# Patient Record
Sex: Female | Born: 2000
Health system: Southern US, Community
[De-identification: ages and names within clinical notes are randomized; demographics above are authoritative.]

## PROBLEM LIST (undated history)

## (undated) DIAGNOSIS — F32A Depression, unspecified: Secondary | ICD-10-CM

## (undated) DIAGNOSIS — F329 Major depressive disorder, single episode, unspecified: Secondary | ICD-10-CM

## (undated) DIAGNOSIS — F419 Anxiety disorder, unspecified: Secondary | ICD-10-CM

## (undated) HISTORY — PX: THYROID CYST EXCISION: SHX2511

## (undated) HISTORY — DX: Anxiety disorder, unspecified: F41.9

## (undated) HISTORY — DX: Major depressive disorder, single episode, unspecified: F32.9

## (undated) HISTORY — DX: Depression, unspecified: F32.A

---
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
## 2018-03-16 IMAGING — US US EXTREM LOW VENOUS*R*
1 series · 13 of 24 positions shown · non-contrast
Comparison: None.

CLINICAL DATA: Right ankle and knee pain.



[Series 1: us extrem low venous*right* · 0.06mm/px · 13 of 36 slices shown]
[im 1/36]
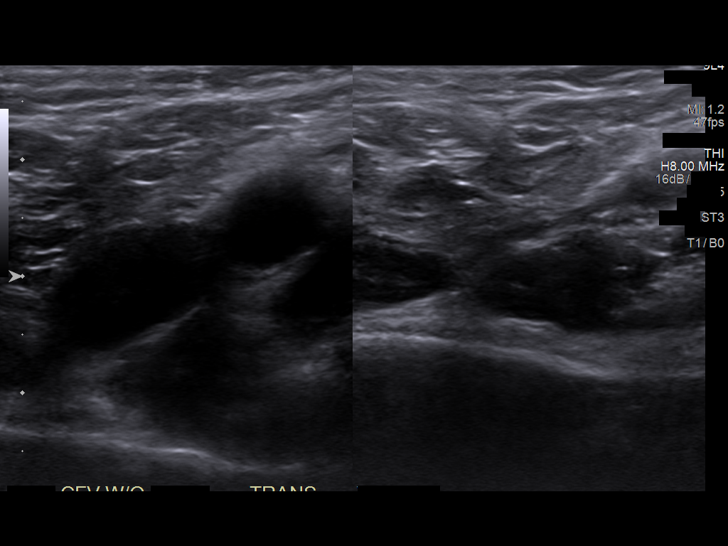
[im 4/36]
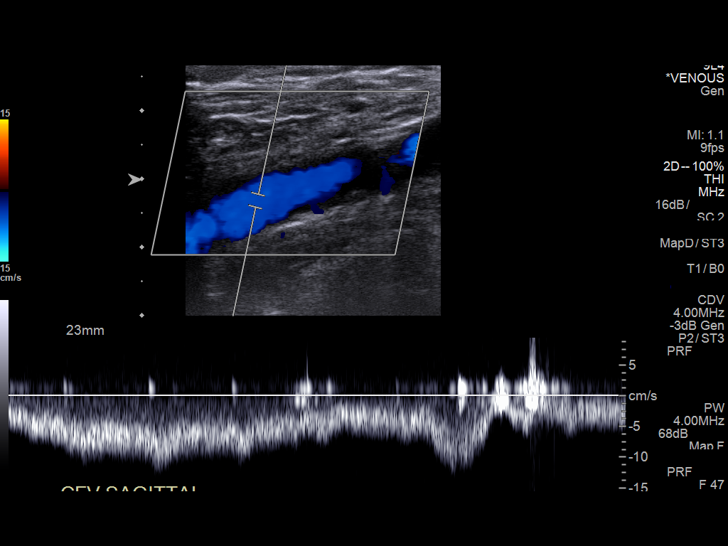
[im 7/36]
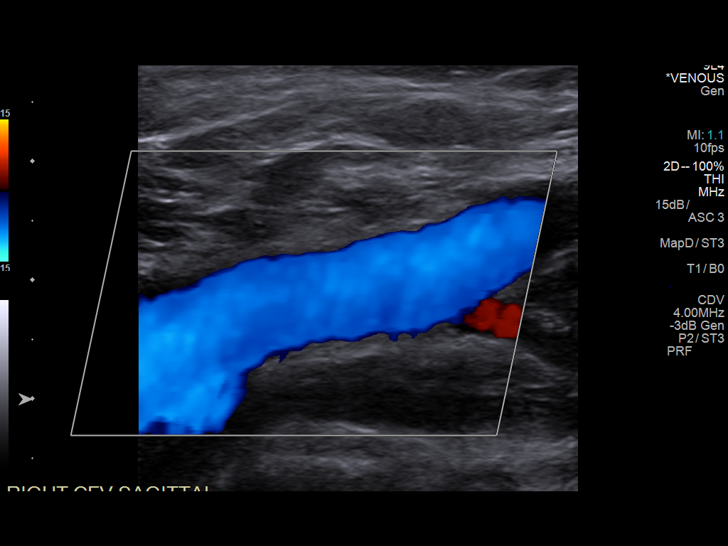
[im 10/36]
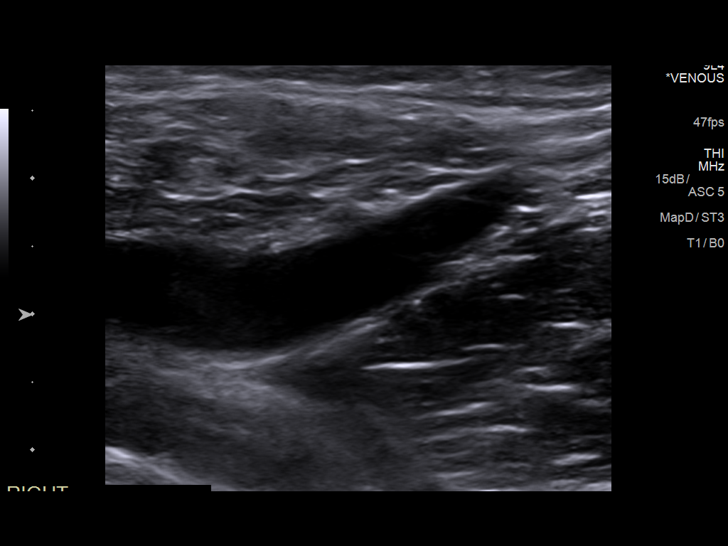
[im 13/36]
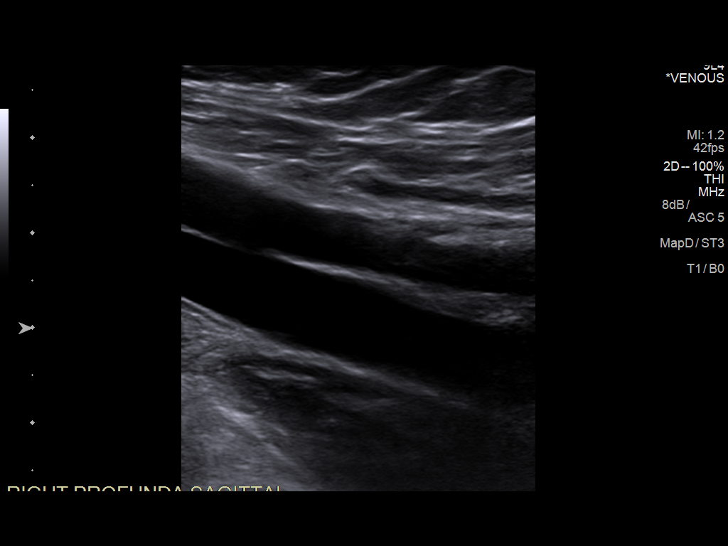
[im 16/36]
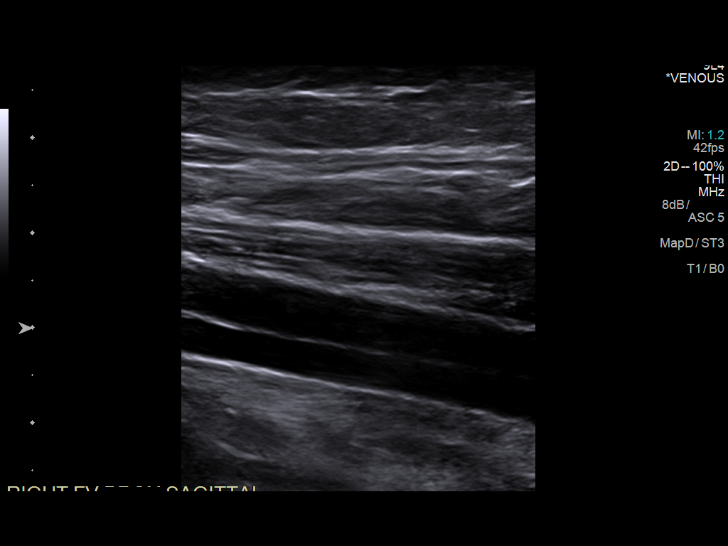
[im 19/36]
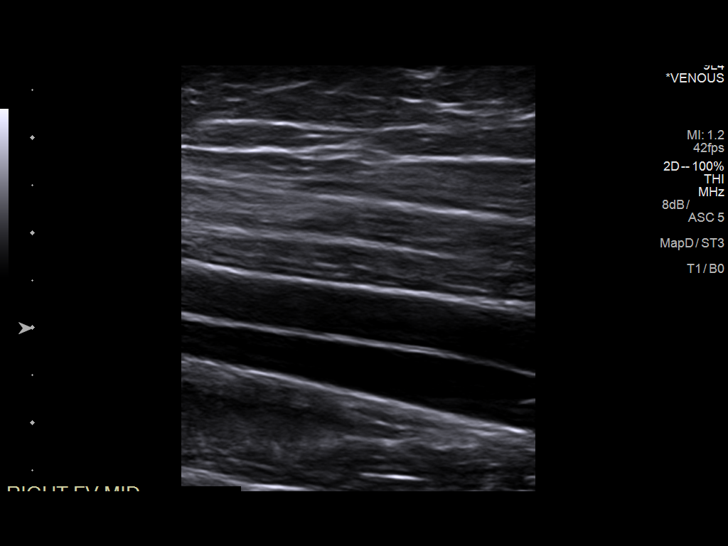
[im 20/36]
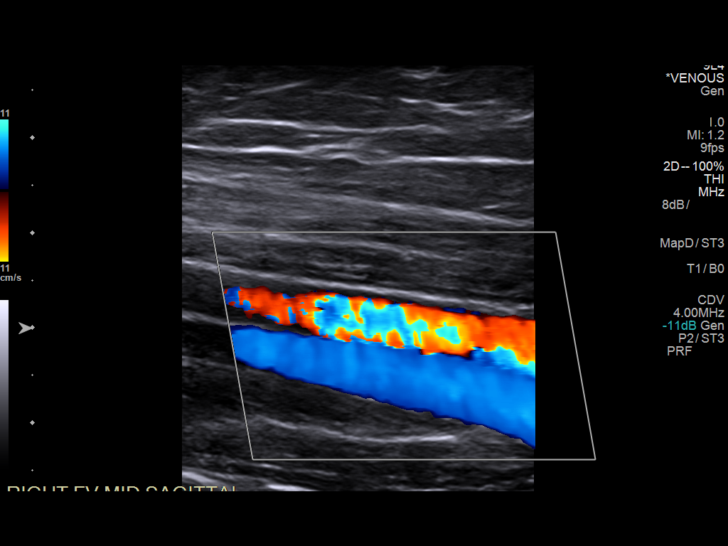
[im 23/36]
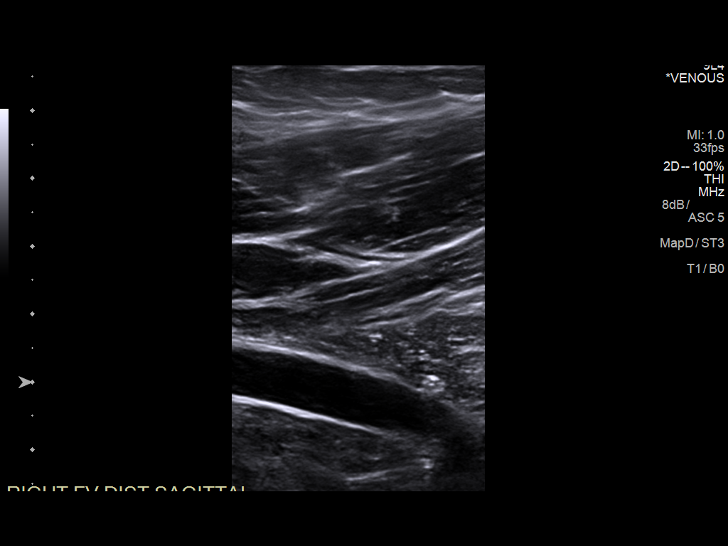
[im 26/36]
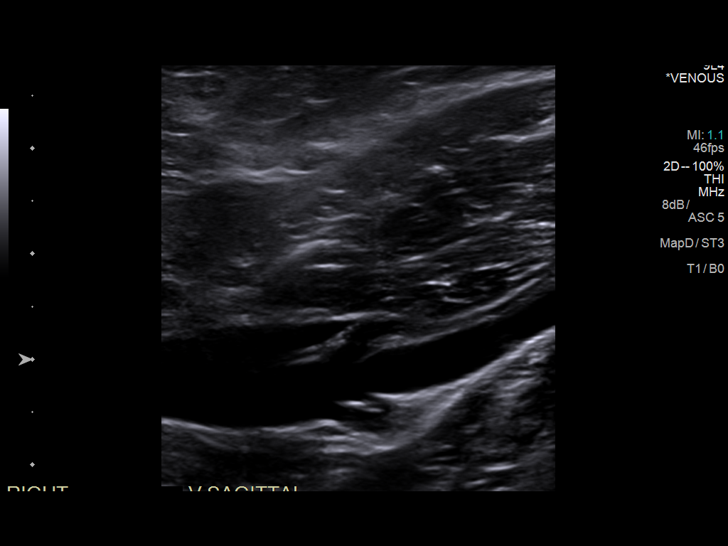
[im 29/36]
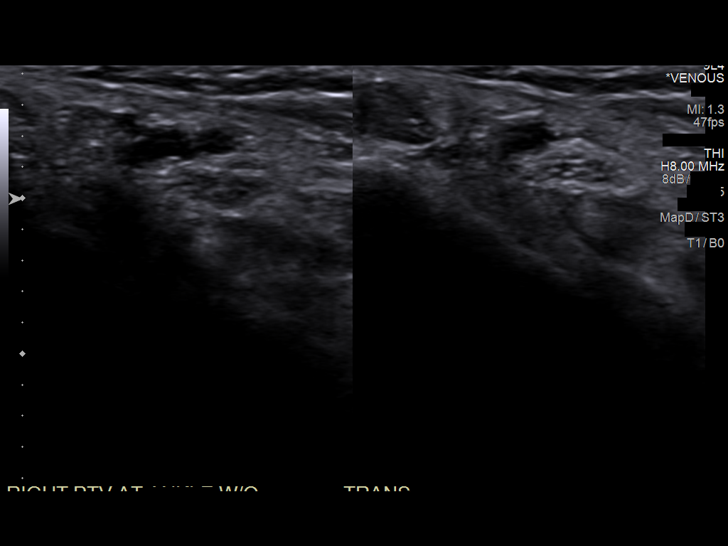
[im 32/36]
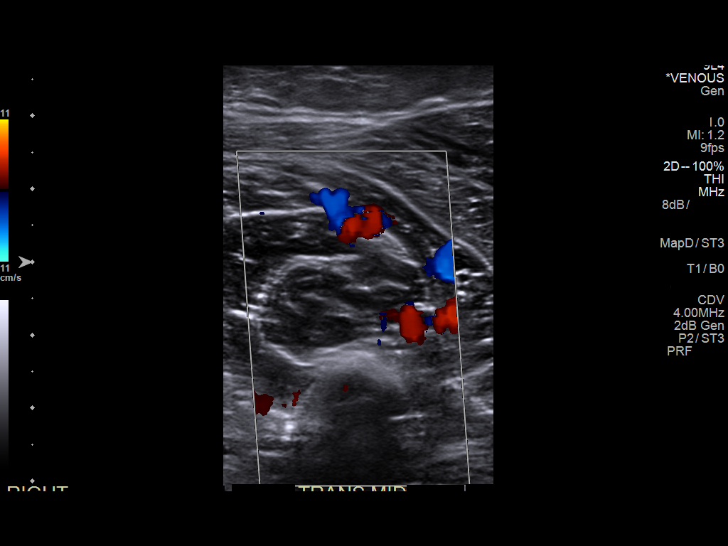
[im 36/36]
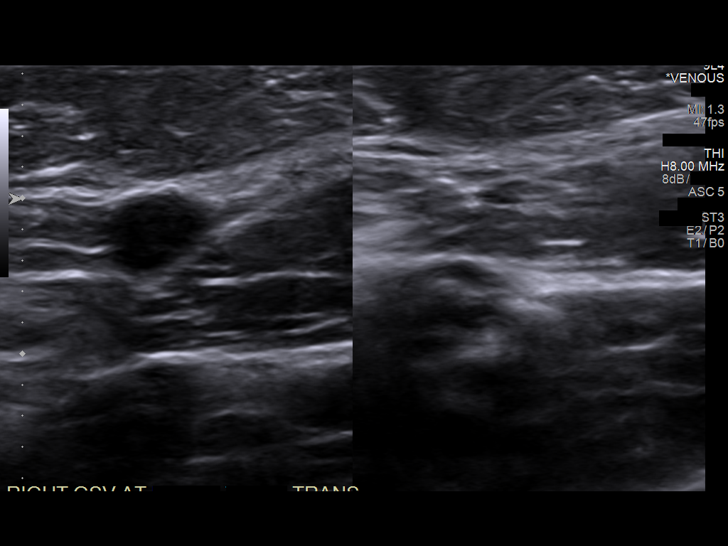

[13 of 24 positions shown; findings below may reference images not displayed]

FINDINGS: Contralateral Common Femoral Vein: Respiratory phasicity is normal
and symmetric with the symptomatic side. No evidence of thrombus.
Normal compressibility.

Common Femoral Vein: No evidence of thrombus. Normal
compressibility, respiratory phasicity and response to augmentation.

Saphenofemoral Junction: No evidence of thrombus. Normal
compressibility and flow on color Doppler imaging.

Profunda Femoral Vein: No evidence of thrombus. Normal
compressibility and flow on color Doppler imaging.

Femoral Vein: No evidence of thrombus. Normal compressibility,
respiratory phasicity and response to augmentation.

Popliteal Vein: No evidence of thrombus. Normal compressibility,
respiratory phasicity and response to augmentation.

Calf Veins: No evidence of thrombus. Normal compressibility and flow
on color Doppler imaging.

Superficial Great Saphenous Vein: No evidence of thrombus. Normal
compressibility and flow on color Doppler imaging.

Venous Reflux:  None.

Other Findings: No evidence of superficial thrombophlebitis or
abnormal fluid collection.
IMPRESSION: No evidence of right lower extremity deep venous thrombosis.

## 2018-03-27 ENCOUNTER — Other Ambulatory Visit: Payer: Self-pay | Admitting: Family Medicine

## 2018-03-27 DIAGNOSIS — F411 Generalized anxiety disorder: Secondary | ICD-10-CM

## 2018-04-27 ENCOUNTER — Other Ambulatory Visit: Payer: Self-pay | Admitting: Family Medicine

## 2018-04-27 DIAGNOSIS — F411 Generalized anxiety disorder: Secondary | ICD-10-CM

## 2018-05-15 DIAGNOSIS — J0140 Acute pansinusitis, unspecified: Secondary | ICD-10-CM | POA: Diagnosis not present

## 2018-05-21 ENCOUNTER — Other Ambulatory Visit: Payer: Self-pay | Admitting: Family Medicine

## 2018-05-21 DIAGNOSIS — F411 Generalized anxiety disorder: Secondary | ICD-10-CM

## 2018-06-20 ENCOUNTER — Other Ambulatory Visit: Payer: Self-pay | Admitting: Family Medicine

## 2018-06-20 DIAGNOSIS — F411 Generalized anxiety disorder: Secondary | ICD-10-CM

## 2018-07-18 ENCOUNTER — Other Ambulatory Visit: Payer: Self-pay | Admitting: Family Medicine

## 2018-07-18 DIAGNOSIS — Z7689 Persons encountering health services in other specified circumstances: Secondary | ICD-10-CM

## 2018-07-22 ENCOUNTER — Other Ambulatory Visit: Payer: Self-pay | Admitting: Family Medicine

## 2018-07-22 DIAGNOSIS — F411 Generalized anxiety disorder: Secondary | ICD-10-CM

## 2018-08-16 DIAGNOSIS — Z113 Encounter for screening for infections with a predominantly sexual mode of transmission: Secondary | ICD-10-CM | POA: Diagnosis not present

## 2018-08-16 DIAGNOSIS — Z6822 Body mass index (BMI) 22.0-22.9, adult: Secondary | ICD-10-CM | POA: Diagnosis not present

## 2018-08-16 DIAGNOSIS — N760 Acute vaginitis: Secondary | ICD-10-CM | POA: Diagnosis not present

## 2018-08-16 DIAGNOSIS — Z01419 Encounter for gynecological examination (general) (routine) without abnormal findings: Secondary | ICD-10-CM | POA: Diagnosis not present

## 2018-08-22 ENCOUNTER — Other Ambulatory Visit: Payer: Self-pay | Admitting: Family Medicine

## 2018-08-22 DIAGNOSIS — F411 Generalized anxiety disorder: Secondary | ICD-10-CM

## 2018-09-10 DIAGNOSIS — M9905 Segmental and somatic dysfunction of pelvic region: Secondary | ICD-10-CM | POA: Diagnosis not present

## 2018-09-10 DIAGNOSIS — M9903 Segmental and somatic dysfunction of lumbar region: Secondary | ICD-10-CM | POA: Diagnosis not present

## 2018-09-10 DIAGNOSIS — M9902 Segmental and somatic dysfunction of thoracic region: Secondary | ICD-10-CM | POA: Diagnosis not present

## 2018-09-10 DIAGNOSIS — M9901 Segmental and somatic dysfunction of cervical region: Secondary | ICD-10-CM | POA: Diagnosis not present

## 2018-09-16 ENCOUNTER — Other Ambulatory Visit: Payer: Self-pay | Admitting: Family Medicine

## 2018-09-16 DIAGNOSIS — F411 Generalized anxiety disorder: Secondary | ICD-10-CM

## 2018-09-22 DIAGNOSIS — J069 Acute upper respiratory infection, unspecified: Secondary | ICD-10-CM | POA: Diagnosis not present

## 2018-09-22 DIAGNOSIS — R6889 Other general symptoms and signs: Secondary | ICD-10-CM | POA: Diagnosis not present

## 2018-12-29 ENCOUNTER — Other Ambulatory Visit: Payer: Self-pay | Admitting: Family Medicine

## 2018-12-29 DIAGNOSIS — Z7689 Persons encountering health services in other specified circumstances: Secondary | ICD-10-CM

## 2018-12-29 MED ORDER — NORETHIN ACE-ETH ESTRAD-FE 1-20 MG-MCG PO TABS: 1.0000 | ORAL_TABLET | Freq: Every day | ORAL | 0 refills | Status: DC

## 2018-12-29 NOTE — Telephone Encounter (Signed)
Refill request for Junell; no valid encounter in last 12 months; no upcoming visits noted; last refill 07/18/18; contacted pt and made her aware; she verbalized understanding and would like to schedule appointment; the pt can be contacted at [email or web address] or [phone]; a message can be left on voicemail; conference called initiated with Annice Pih at [facility]; per Annice Pih pt scheduled virtual appointment with Dr Abbe Amsterdam, [facility], 12/30/2018 at 1020; she verbalized understanding; will grant 1 month courtesy refill; will route to office for notification.   Requested Prescriptions  Pending Prescriptions Disp Refills  . norethindrone-ethinyl estradiol (JUNEL FE 1/20) 1-20 MG-MCG tablet 84 tablet 1    Sig: Take 1 tablet by mouth daily.     OB/GYN:  Contraceptives Failed - 12/29/2018  2:34 PM      Failed - Valid encounter within last 12 months    Recent Outpatient Visits          1 year ago Concussion without loss of consciousness, initial encounter   Holiday representative at [facility] [place], Gwenlyn Found, MD   2 years ago Flu-like symptoms   Holiday representative at [facility], [place], [place]   2 years ago Chronic fatigue   Holiday representative at [facility], Gwenlyn Found, MD             Passed - Last BP in normal range    BP Readings from Last 1 Encounters:  09/10/17 98/61

## 2018-12-30 ENCOUNTER — Encounter: Payer: Self-pay | Admitting: Family Medicine

## 2018-12-30 ENCOUNTER — Ambulatory Visit (INDEPENDENT_AMBULATORY_CARE_PROVIDER_SITE_OTHER): Payer: Federal, State, Local not specified - PPO | Admitting: Family Medicine

## 2018-12-30 ENCOUNTER — Other Ambulatory Visit: Payer: Self-pay

## 2018-12-30 DIAGNOSIS — F411 Generalized anxiety disorder: Secondary | ICD-10-CM | POA: Diagnosis not present

## 2018-12-30 DIAGNOSIS — Z7689 Persons encountering health services in other specified circumstances: Secondary | ICD-10-CM | POA: Diagnosis not present

## 2018-12-30 MED ORDER — NORETHIN ACE-ETH ESTRAD-FE 1-20 MG-MCG PO TABS: 1.0000 | ORAL_TABLET | Freq: Every day | ORAL | 4 refills | Status: DC

## 2018-12-30 MED ORDER — FLUOXETINE HCL 40 MG PO CAPS: ORAL_CAPSULE | ORAL | 3 refills | Status: DC

## 2018-12-30 NOTE — Progress Notes (Signed)
[facility] [address], [address] [place], [place] [address] [phone] [phone]  Date:  12/30/2018   Name:  Leah Park   DOB:  05/31/2001   MRN:  [id number]  PCP:  Pearline Cablesopland, Maeve Debord C, MD    Chief Complaint: No chief complaint on file.   History of Present Illness:  Leah Park is a 18 y.o. very pleasant female patient who presents with the following:  Pt who I last saw in 08/2017, here today to discuss contraception Pt ID confirmed by name and DOB Pt location is home Provider location is office Patient consents to virtual visit today due to COVID-19 pandemic  She has been on OCP since middle school for menstrual regulation She feels like it works well for her, she has no concerns Her menses are well controlled She does a monthly bleed  No history of cancer or stroke, no blood clot Never a smoker She is not currently sexually active She does not check her BP at home, but her pressure has always been quite normal when checked in the past  BP Readings from Last 3 Encounters:  09/10/17 98/61  04/23/17 107/75  10/31/16 121/71 (77 %, Z = 0.74 /  67 %, Z = 0.43)*   *BP percentiles are based on the 2017 AAP Clinical Practice Guideline for girls   She did run out of her pills for a few days, but her pharmacy was then able to resupply her. She is not SA so we are not concerned about pregnancy  She is using prozac for anxiety- she does feel like it helps control her symptoms She does not have much in the way of depression, more anxiety.  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  She does actually like this style of learning as it let's her work at her own pace She was supposed to have an internship this summer but it got canceled.  She hopes to get a summer job instead, perhaps at [employer]  Patient Active Problem List   Diagnosis Date Noted  . 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 GAD (generalized anxiety disorder) 12/22/2013  . MDD (major depressive disorder) 12/22/2013    Past Medical History:  Diagnosis Date  . Anxiety   . Depression     Past Surgical History:  Procedure Laterality Date  . THYROID CYST EXCISION      Social History   Tobacco Use  . Smoking status: Never Smoker  . Smokeless tobacco: Never Used  Substance Use Topics  . Alcohol use: No  . Drug use: No    Family History  Problem Relation Age of Onset  . Depression Maternal Aunt   . Anxiety disorder Maternal Aunt   . Bipolar disorder Maternal Aunt   . Anxiety disorder Cousin     No Known Allergies  Medication list has been reviewed and updated.  Current Outpatient Medications on File Prior to Visit  Medication Sig Dispense Refill  . FLUoxetine (PROZAC) 40 MG capsule TAKE 1 CAPSULE BY MOUTH EVERY DAY 30 capsule 0  . Multiple Vitamins-Iron (ONE DAILY MULTIVITAMIN/IRON) TABS Take by mouth.    . norethindrone-ethinyl estradiol (JUNEL FE 1/20) 1-20 MG-MCG tablet Take 1 tablet by mouth daily. 84 tablet 0  . Probiotic Product (PROBIOTIC DAILY PO) Take by mouth.     No current facility-administered  medications on file prior to visit.     Review of Systems:  As per HPI- otherwise negative. No cough or fever  Physical Examination: There were no vitals filed for this visit. There were no vitals filed for this visit. There is no height or weight on file to calculate BMI. Ideal Body Weight:    Pt observed via web video today  She appears well, normal weight, no distress, no cough, wheezing, tachypnea is noted  Assessment and Plan: Encounter for menstrual regulation - Plan: norethindrone-ethinyl estradiol (JUNEL FE 1/20) 1-20 MG-MCG tablet  GAD (generalized anxiety disorder) - Plan: FLUoxetine (PROZAC) 40 MG capsule  Virtual visit today to discuss birth control pills and anxiety.  I refilled her birth control pills for 1  year, also refilled her Prozac She is handling the upheaval in her life well, but I asked her to let me know if her anxiety is getting the best of her We will send a summary message to patient via my chart  Signed Abbe Amsterdam, MD

## 2018-12-30 NOTE — Patient Instructions (Signed)
It was very nice to talk with you today I hope that the rest of your school year continues to go well online  Continue Prozac 40 mg daily for anxiety-let me know if your symptoms are worsening due to current circumstances Continue birth control pills.  If you did miss a few pills due to running out, there is a chance she could become pregnant if you were sexually active around that time.  If you have any concerns, certainly take an over-the-counter home pregnancy test.  Please see me in the office in 4 to 6 months, so we can check on your blood pressure and talk face-to-face  Take care!

## 2019-01-28 DIAGNOSIS — M9901 Segmental and somatic dysfunction of cervical region: Secondary | ICD-10-CM | POA: Diagnosis not present

## 2019-01-28 DIAGNOSIS — M9903 Segmental and somatic dysfunction of lumbar region: Secondary | ICD-10-CM | POA: Diagnosis not present

## 2019-01-28 DIAGNOSIS — M9905 Segmental and somatic dysfunction of pelvic region: Secondary | ICD-10-CM | POA: Diagnosis not present

## 2019-01-28 DIAGNOSIS — M9902 Segmental and somatic dysfunction of thoracic region: Secondary | ICD-10-CM | POA: Diagnosis not present

## 2019-03-04 DIAGNOSIS — N930 Postcoital and contact bleeding: Secondary | ICD-10-CM | POA: Diagnosis not present

## 2019-03-04 DIAGNOSIS — N760 Acute vaginitis: Secondary | ICD-10-CM | POA: Diagnosis not present

## 2019-03-04 DIAGNOSIS — Z113 Encounter for screening for infections with a predominantly sexual mode of transmission: Secondary | ICD-10-CM | POA: Diagnosis not present

## 2019-03-19 ENCOUNTER — Telehealth: Payer: Self-pay | Admitting: Family Medicine

## 2019-03-19 DIAGNOSIS — Z20822 Contact with and (suspected) exposure to covid-19: Secondary | ICD-10-CM

## 2019-03-19 NOTE — Telephone Encounter (Signed)
Spoke with pt's mother and pt scheduled June 29 at 11:15 at [facility]- Pt's mother instructed that everyone should wear face masks and stay in car.

## 2019-03-21 ENCOUNTER — Other Ambulatory Visit: Payer: Federal, State, Local not specified - PPO

## 2019-03-21 DIAGNOSIS — R6889 Other general symptoms and signs: Secondary | ICD-10-CM | POA: Diagnosis not present

## 2019-03-26 LAB — NOVEL CORONAVIRUS, NAA: SARS-CoV-2, NAA: NOT DETECTED

## 2019-04-17 DIAGNOSIS — J22 Unspecified acute lower respiratory infection: Secondary | ICD-10-CM | POA: Diagnosis not present

## 2019-04-17 DIAGNOSIS — Z20828 Contact with and (suspected) exposure to other viral communicable diseases: Secondary | ICD-10-CM | POA: Diagnosis not present

## 2019-08-25 ENCOUNTER — Encounter: Payer: Self-pay | Admitting: Family Medicine

## 2019-08-25 ENCOUNTER — Ambulatory Visit (INDEPENDENT_AMBULATORY_CARE_PROVIDER_SITE_OTHER): Payer: Federal, State, Local not specified - PPO | Admitting: Family Medicine

## 2019-08-25 ENCOUNTER — Other Ambulatory Visit: Payer: Self-pay

## 2019-08-25 DIAGNOSIS — J069 Acute upper respiratory infection, unspecified: Secondary | ICD-10-CM | POA: Diagnosis not present

## 2019-08-25 NOTE — Progress Notes (Signed)
[facility] at [facility] [address] [place], [place] [address] [phone]  Date:  08/25/2019   Name:  Leah Park   DOB:  [birth year]   MRN:  [id number]  PCP:  Darreld Mclean, MD    Chief Complaint: No chief complaint on file.   History of Present Illness:  Leah Park is a 18 y.o. 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 GAD (generalized anxiety disorder) 12/22/2013  . MDD (major depressive disorder) 12/22/2013    Past Medical History:  Diagnosis Date  . Anxiety   . Depression     Past Surgical History:  Procedure Laterality Date  . THYROID CYST EXCISION      Social History   Tobacco Use  . Smoking status: Never Smoker  . Smokeless tobacco: Never Used  Substance Use Topics  . Alcohol use: No  . Drug use: No    Family History  Problem Relation Age of Onset  . Depression Maternal Aunt   . Anxiety disorder Maternal Aunt   . Bipolar disorder Maternal Aunt   . Anxiety disorder Cousin      No Known Allergies  Medication list has been reviewed and updated.  Current Outpatient Medications on File Prior to Visit  Medication Sig Dispense Refill  . FLUoxetine (PROZAC) 40 MG capsule TAKE 1 CAPSULE BY MOUTH EVERY DAY 90 capsule 3  . Multiple Vitamins-Iron (ONE DAILY MULTIVITAMIN/IRON) TABS Take by mouth.    . norethindrone-ethinyl estradiol (JUNEL FE 1/20) 1-20 MG-MCG tablet Take 1 tablet by mouth daily. 3 Package 4  . Probiotic Product (PROBIOTIC DAILY PO) Take by mouth.     No current facility-administered medications on file prior to visit.     Review of Systems:  As per HPI- otherwise negative.   Physical Examination: There were no vitals filed for this visit. There were no vitals filed for this visit. There is no height or weight on file to calculate BMI. Ideal Body Weight:    No fever noted Spoke to pt on the telephone, she sounds well.  No cough, wheezing, shortness of breath is noted  Assessment and Plan: Upper respiratory tract infection, unspecified type - Plan: Novel Coronavirus, NAA ([facility])  Virtual visit today to discuss relatively mild URI symptoms.  We are in the midst of a COVID-19 pandemic, patient should be tested to ensure she does not have COVID-19.  I have ordered this test for her, explained  various testing options.  She will plan to be tested ASAP, we will then follow-up pending her results.  I have asked her to contact me if any worsening, or any other questions Advised patient to quarantine herself until results come in  Spoke with patient on the phone for 7 minutes  Signed Abbe Amsterdam, MD

## 2019-08-26 ENCOUNTER — Other Ambulatory Visit: Payer: Self-pay

## 2019-08-26 DIAGNOSIS — Z20822 Contact with and (suspected) exposure to covid-19: Secondary | ICD-10-CM

## 2019-08-27 LAB — NOVEL CORONAVIRUS, NAA: SARS-CoV-2, NAA: NOT DETECTED

## 2019-10-24 DIAGNOSIS — F411 Generalized anxiety disorder: Secondary | ICD-10-CM | POA: Diagnosis not present

## 2019-10-31 DIAGNOSIS — F411 Generalized anxiety disorder: Secondary | ICD-10-CM | POA: Diagnosis not present

## 2019-10-31 DIAGNOSIS — F429 Obsessive-compulsive disorder, unspecified: Secondary | ICD-10-CM | POA: Diagnosis not present

## 2019-11-14 DIAGNOSIS — F411 Generalized anxiety disorder: Secondary | ICD-10-CM | POA: Diagnosis not present

## 2019-11-28 DIAGNOSIS — F411 Generalized anxiety disorder: Secondary | ICD-10-CM | POA: Diagnosis not present

## 2020-01-11 ENCOUNTER — Other Ambulatory Visit: Payer: Self-pay | Admitting: Family Medicine

## 2020-01-11 DIAGNOSIS — F411 Generalized anxiety disorder: Secondary | ICD-10-CM

## 2020-01-24 DIAGNOSIS — F411 Generalized anxiety disorder: Secondary | ICD-10-CM | POA: Diagnosis not present

## 2020-01-25 ENCOUNTER — Other Ambulatory Visit: Payer: Self-pay | Admitting: Family Medicine

## 2020-01-25 DIAGNOSIS — Z7689 Persons encountering health services in other specified circumstances: Secondary | ICD-10-CM

## 2020-02-08 DIAGNOSIS — F411 Generalized anxiety disorder: Secondary | ICD-10-CM | POA: Diagnosis not present

## 2020-02-22 DIAGNOSIS — F429 Obsessive-compulsive disorder, unspecified: Secondary | ICD-10-CM | POA: Diagnosis not present

## 2020-02-22 DIAGNOSIS — F411 Generalized anxiety disorder: Secondary | ICD-10-CM | POA: Diagnosis not present

## 2020-03-12 DIAGNOSIS — Z309 Encounter for contraceptive management, unspecified: Secondary | ICD-10-CM | POA: Diagnosis not present

## 2020-03-12 DIAGNOSIS — Z113 Encounter for screening for infections with a predominantly sexual mode of transmission: Secondary | ICD-10-CM | POA: Diagnosis not present

## 2020-03-12 DIAGNOSIS — Z01419 Encounter for gynecological examination (general) (routine) without abnormal findings: Secondary | ICD-10-CM | POA: Diagnosis not present

## 2020-03-12 DIAGNOSIS — N86 Erosion and ectropion of cervix uteri: Secondary | ICD-10-CM | POA: Diagnosis not present

## 2020-03-15 LAB — HM PAP SMEAR

## 2020-03-27 DIAGNOSIS — F411 Generalized anxiety disorder: Secondary | ICD-10-CM | POA: Diagnosis not present

## 2020-03-27 DIAGNOSIS — F429 Obsessive-compulsive disorder, unspecified: Secondary | ICD-10-CM | POA: Diagnosis not present

## 2020-03-28 DIAGNOSIS — L0100 Impetigo, unspecified: Secondary | ICD-10-CM | POA: Diagnosis not present

## 2020-03-28 DIAGNOSIS — J029 Acute pharyngitis, unspecified: Secondary | ICD-10-CM | POA: Diagnosis not present

## 2020-03-28 DIAGNOSIS — J020 Streptococcal pharyngitis: Secondary | ICD-10-CM | POA: Diagnosis not present

## 2020-04-12 ENCOUNTER — Other Ambulatory Visit: Payer: Self-pay | Admitting: Family Medicine

## 2020-04-12 DIAGNOSIS — Z7689 Persons encountering health services in other specified circumstances: Secondary | ICD-10-CM

## 2020-04-17 DIAGNOSIS — F411 Generalized anxiety disorder: Secondary | ICD-10-CM | POA: Diagnosis not present

## 2020-04-17 DIAGNOSIS — F429 Obsessive-compulsive disorder, unspecified: Secondary | ICD-10-CM | POA: Diagnosis not present

## 2020-05-02 ENCOUNTER — Encounter: Payer: Self-pay | Admitting: Family Medicine

## 2020-05-02 ENCOUNTER — Other Ambulatory Visit: Payer: Self-pay | Admitting: Family Medicine

## 2020-05-02 NOTE — Telephone Encounter (Signed)
Patient need appointment ?

## 2020-12-10 DIAGNOSIS — F411 Generalized anxiety disorder: Secondary | ICD-10-CM | POA: Diagnosis not present

## 2020-12-18 ENCOUNTER — Ambulatory Visit: Payer: Federal, State, Local not specified - PPO | Admitting: Orthopaedic Surgery

## 2020-12-19 DIAGNOSIS — F411 Generalized anxiety disorder: Secondary | ICD-10-CM | POA: Diagnosis not present

## 2021-01-02 DIAGNOSIS — F411 Generalized anxiety disorder: Secondary | ICD-10-CM | POA: Diagnosis not present

## 2021-01-21 DIAGNOSIS — F411 Generalized anxiety disorder: Secondary | ICD-10-CM | POA: Diagnosis not present

## 2021-02-26 ENCOUNTER — Other Ambulatory Visit: Payer: Self-pay | Admitting: Family Medicine

## 2021-02-26 DIAGNOSIS — F411 Generalized anxiety disorder: Secondary | ICD-10-CM

## 2021-03-09 NOTE — Progress Notes (Deleted)
[facility] at [facility] [address] [place], [place] [address] [phone]  Date:  03/11/2021   Name:  Leah Park   DOB:  03-Oct-2000   MRN:  [id number]  PCP:  Pearline Cables, MD    Chief Complaint: No chief complaint on file.   History of Present Illness:  Leah Park is a 20 y.o. 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 [employer]  STI screening Pap Hpv Covid series Tetanus  Routine BW  Patient Active Problem List   Diagnosis Date Noted   Coccygeal fistula 09/03/2015   GAD (generalized anxiety disorder) 12/22/2013   MDD (major depressive disorder) 12/22/2013    Past Medical History:  Diagnosis Date   Anxiety    Depression     Past Surgical History:  Procedure Laterality Date   THYROID CYST EXCISION      Social History   Tobacco Use   Smoking status: Never   Smokeless tobacco: Never  Substance Use Topics   Alcohol use: No   Drug use: No    Family History  Problem Relation Age of Onset   Depression Maternal Aunt    Anxiety disorder Maternal Aunt    Bipolar disorder Maternal Aunt    Anxiety disorder Cousin     No Known Allergies  Medication list has been reviewed and updated.  Current Outpatient Medications on File Prior to Visit  Medication Sig Dispense Refill   FLUoxetine (PROZAC) 40 MG capsule TAKE 1 CAPSULE BY MOUTH EVERY DAY 90 capsule 3   JUNEL FE 1/20 1-20 MG-MCG tablet TAKE 1 TABLET BY MOUTH EVERY DAY 84 tablet 0   Multiple Vitamins-Iron (ONE DAILY MULTIVITAMIN/IRON) TABS Take by mouth.     Probiotic Product (PROBIOTIC DAILY PO) Take by mouth.     No current facility-administered medications on file prior to visit.    Review of Systems:  As per HPI-  otherwise negative.   Physical Examination: There were no vitals filed for this visit. There were no vitals filed for this visit. There is no height or weight on file to calculate BMI. Ideal Body Weight:    GEN: no acute distress. HEENT: Atraumatic, Normocephalic.  Ears and Nose: No external deformity. CV: RRR, No M/G/R. No JVD. No thrill. No extra heart sounds. PULM: CTA B, no wheezes, crackles, rhonchi. No retractions. No resp. distress. No accessory muscle use. ABD: S, NT, ND, +BS. No rebound. No HSM. EXTR: No c/c/e PSYCH: Normally interactive. Conversant.    Assessment and Plan:  This visit occurred during the SARS-CoV-2 public health emergency.  Safety protocols were in place, including screening questions prior to the visit, additional usage of staff PPE, and extensive cleaning of exam room while observing appropriate contact time as indicated for disinfecting solutions.   Signed Abbe Amsterdam, MD

## 2021-03-11 ENCOUNTER — Ambulatory Visit: Payer: Federal, State, Local not specified - PPO | Admitting: Family Medicine

## 2021-03-29 ENCOUNTER — Encounter: Payer: Self-pay | Admitting: Family

## 2021-03-29 ENCOUNTER — Telehealth (INDEPENDENT_AMBULATORY_CARE_PROVIDER_SITE_OTHER): Payer: Federal, State, Local not specified - PPO | Admitting: Family

## 2021-03-29 ENCOUNTER — Other Ambulatory Visit: Payer: Self-pay

## 2021-03-29 DIAGNOSIS — F411 Generalized anxiety disorder: Secondary | ICD-10-CM

## 2021-03-29 DIAGNOSIS — Z7689 Persons encountering health services in other specified circumstances: Secondary | ICD-10-CM | POA: Diagnosis not present

## 2021-03-29 MED ORDER — FLUOXETINE HCL 40 MG PO CAPS: 40.0000 mg | ORAL_CAPSULE | Freq: Every day | ORAL | 0 refills | Status: DC

## 2021-03-29 MED ORDER — NORETHIN ACE-ETH ESTRAD-FE 1-20 MG-MCG PO TABS: 1.0000 | ORAL_TABLET | Freq: Every day | ORAL | 0 refills | Status: AC

## 2021-03-29 NOTE — Progress Notes (Signed)
  Leah Park is a 20 y.o. female with the following history as recorded in EpicCare:  Patient Active Problem List   Diagnosis Date Noted   Coccygeal fistula 09/03/2015   GAD (generalized anxiety disorder) 12/22/2013   MDD (major depressive disorder) 12/22/2013    Current Outpatient Medications  Medication Sig Dispense Refill   FLUoxetine (PROZAC) 40 MG capsule Take 1 capsule (40 mg total) by mouth daily. 90 capsule 0   norethindrone-ethinyl estradiol-FE (JUNEL FE 1/20) 1-20 MG-MCG tablet Take 1 tablet by mouth daily. 84 tablet 0   No current facility-administered medications for this visit.    Allergies: Patient has no known allergies.  Past Medical History:  Diagnosis Date   Anxiety    Depression     Past Surgical History:  Procedure Laterality Date   THYROID CYST EXCISION      Family History  Problem Relation Age of Onset   Depression Maternal Aunt    Anxiety disorder Maternal Aunt    Bipolar disorder Maternal Aunt    Anxiety disorder Cousin     Social History   Tobacco Use   Smoking status: Never   Smokeless tobacco: Never  Substance Use Topics   Alcohol use: No    Subjective:   I connected with Leah Park on 03/29/21 at  9:20 AM EDT by a video enabled telemedicine application and verified that I am speaking with the correct person using two identifiers.   I discussed the limitations of evaluation and management by telemedicine and the availability of in person appointments. The patient expressed understanding and agreed to proceed. Provider in office/ patient is at home; provider and patient are only 2 people on video call.   Patient has last seen her PCP on 08/25/2019; notes she is actually moving to [place] and needs short-term refill on her medications until she can get established with new PCP in [place]. 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  Head: Normocephalic and atraumatic  Lungs: Respirations unlabored;  Neurologic: Alert and oriented; speech intact; face symmetrical;   Assessment:  1. GAD (generalized anxiety disorder)   2. Encounter for menstrual regulation     Plan:  Patient is overdue for in person OV and understands she has to be seen in person for long-term refills. Will give 3 month refill on her Prozac and OCPs; she will plan to establish with new PCP in [place] or see Dr. Patsy Lager in person.     No follow-ups on file.  No orders of the defined types were placed in this encounter.   Requested Prescriptions   Signed Prescriptions Disp Refills   FLUoxetine (PROZAC) 40 MG capsule 90 capsule 0    Sig: Take 1 capsule (40 mg total) by mouth daily.   norethindrone-ethinyl estradiol-FE (JUNEL FE 1/20) 1-20 MG-MCG tablet 84 tablet 0    Sig: Take 1 tablet by mouth daily.

## 2021-05-22 DIAGNOSIS — F331 Major depressive disorder, recurrent, moderate: Secondary | ICD-10-CM | POA: Diagnosis not present

## 2021-05-22 DIAGNOSIS — F411 Generalized anxiety disorder: Secondary | ICD-10-CM | POA: Diagnosis not present

## 2021-06-20 DIAGNOSIS — F411 Generalized anxiety disorder: Secondary | ICD-10-CM | POA: Diagnosis not present

## 2021-06-20 DIAGNOSIS — F331 Major depressive disorder, recurrent, moderate: Secondary | ICD-10-CM | POA: Diagnosis not present

## 2021-07-23 DIAGNOSIS — F331 Major depressive disorder, recurrent, moderate: Secondary | ICD-10-CM | POA: Diagnosis not present

## 2021-07-23 DIAGNOSIS — F411 Generalized anxiety disorder: Secondary | ICD-10-CM | POA: Diagnosis not present

## 2021-09-09 ENCOUNTER — Telehealth: Payer: Federal, State, Local not specified - PPO | Admitting: Family

## 2021-09-09 DIAGNOSIS — R6889 Other general symptoms and signs: Secondary | ICD-10-CM

## 2021-09-09 MED ORDER — OSELTAMIVIR PHOSPHATE 75 MG PO CAPS: 75.0000 mg | ORAL_CAPSULE | Freq: Two times a day (BID) | ORAL | 0 refills | Status: DC

## 2021-09-09 NOTE — Progress Notes (Signed)
Virtual Visit Consent   Leah Park, you are scheduled for a virtual visit with a [facility] provider today.     Just as with appointments in the office, your consent must be obtained to participate.  Your consent will be active for this visit and any virtual visit you may have with one of our providers in the next 365 days.     If you have a MyChart account, a copy of this consent can be sent to you electronically.  All virtual visits are billed to your insurance company just like a traditional visit in the office.    As this is a virtual visit, video technology does not allow for your provider to perform a traditional examination.  This may limit your provider's ability to fully assess your condition.  If your provider identifies any concerns that need to be evaluated in person or the need to arrange testing (such as labs, EKG, etc.), we will make arrangements to do so.     Although advances in technology are sophisticated, we cannot ensure that it will always work on either your end or our end.  If the connection with a video visit is poor, the visit may have to be switched to a telephone visit.  With either a video or telephone visit, we are not always able to ensure that we have a secure connection.     I need to obtain your verbal consent now.   Are you willing to proceed with your visit today?    Leah Park has provided verbal consent on 09/09/2021 for a virtual visit (video or telephone).   Jannifer Rodney, FNP   Date: 09/09/2021 7:34 PM   Virtual Visit via Video Note   I, Jannifer Rodney, connected with  Leah Park  ([id number], June 05, 2001) on 09/09/21 at  7:30 PM EST by a video-enabled telemedicine application and verified that I am speaking with the correct person using two identifiers.  Location: Patient: Virtual Visit Location Patient: Home Provider: Virtual Visit Location Provider: Office/Clinic   I discussed the limitations of evaluation and management by telemedicine  and the availability of in person appointments. The patient expressed understanding and agreed to proceed.    History of Present Illness: Leah Park is a 21 y.o. who identifies as a female who was assigned female at birth, and is being seen today for sore throat. Pt has taken two COVID test.   HPI: Sore Throat  This is a new problem. The current episode started yesterday. The problem has been gradually worsening. Maximum temperature: 99. The pain is at a severity of 7/10. The pain is moderate. Associated symptoms include congestion, coughing and headaches. Pertinent negatives include no ear discharge, ear pain, shortness of breath, swollen glands or trouble swallowing. Associated symptoms comments: Headache, nausea. She has tried acetaminophen and NSAIDs for the symptoms. The treatment provided mild relief.   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   GAD (generalized anxiety disorder) 12/22/2013   MDD (major depressive disorder) 12/22/2013    Allergies: No Known Allergies Medications:  Current Outpatient Medications:    oseltamivir (TAMIFLU) 75 MG capsule, Take 1 capsule (75 mg total) by mouth 2 (two) times daily., Disp: 10 capsule, Rfl: 0   FLUoxetine (PROZAC) 40 MG capsule, Take 1 capsule (40 mg total) by mouth daily., Disp: 90 capsule, Rfl: 0   norethindrone-ethinyl estradiol-FE (JUNEL FE 1/20) 1-20 MG-MCG tablet, Take 1 tablet by mouth daily., Disp: 56  tablet, Rfl: 0  Observations/Objective: Patient is well-developed, well-nourished in no acute distress.  Resting comfortably  at home.  Head is normocephalic, atraumatic.  No labored breathing.  Speech is clear and coherent with logical content.  Patient is alert and oriented at baseline.  Nasal congestion  Assessment and Plan: 1. Flu-like symptoms - oseltamivir (TAMIFLU) 75 MG capsule; Take 1 capsule (75 mg total) by mouth 2 (two) times daily.  Dispense: 10 capsule; Refill:  0 Rest, force fluids, tylenol as needed, report any worsening symptoms such as increased shortness of breath, swelling, or continued high fevers. Possible adverse effects discussed with antivirals.    Follow Up Instructions: I discussed the assessment and treatment plan with the patient. The patient was provided an opportunity to ask questions and all were answered. The patient agreed with the plan and demonstrated an understanding of the instructions.  A copy of instructions were sent to the patient via MyChart unless otherwise noted below.     The patient was advised to call back or seek an in-person evaluation if the symptoms worsen or if the condition fails to improve as anticipated.  Time:  I spent 6 minutes with the patient via telehealth technology discussing the above problems/concerns.    Jannifer Rodney, FNP

## 2021-09-18 DIAGNOSIS — R8279 Other abnormal findings on microbiological examination of urine: Secondary | ICD-10-CM | POA: Diagnosis not present

## 2021-09-18 DIAGNOSIS — Z113 Encounter for screening for infections with a predominantly sexual mode of transmission: Secondary | ICD-10-CM | POA: Diagnosis not present

## 2021-09-18 DIAGNOSIS — N760 Acute vaginitis: Secondary | ICD-10-CM | POA: Diagnosis not present

## 2021-09-18 DIAGNOSIS — N86 Erosion and ectropion of cervix uteri: Secondary | ICD-10-CM | POA: Diagnosis not present

## 2021-09-18 DIAGNOSIS — Z01419 Encounter for gynecological examination (general) (routine) without abnormal findings: Secondary | ICD-10-CM | POA: Diagnosis not present

## 2021-09-18 DIAGNOSIS — Z309 Encounter for contraceptive management, unspecified: Secondary | ICD-10-CM | POA: Diagnosis not present

## 2021-09-18 DIAGNOSIS — N898 Other specified noninflammatory disorders of vagina: Secondary | ICD-10-CM | POA: Diagnosis not present

## 2021-09-24 DIAGNOSIS — F331 Major depressive disorder, recurrent, moderate: Secondary | ICD-10-CM | POA: Diagnosis not present

## 2021-09-24 DIAGNOSIS — F411 Generalized anxiety disorder: Secondary | ICD-10-CM | POA: Diagnosis not present

## 2021-10-18 ENCOUNTER — Telehealth: Payer: Federal, State, Local not specified - PPO | Admitting: Physician Assistant

## 2021-10-18 DIAGNOSIS — R3989 Other symptoms and signs involving the genitourinary system: Secondary | ICD-10-CM

## 2021-10-18 MED ORDER — SULFAMETHOXAZOLE-TRIMETHOPRIM 800-160 MG PO TABS: 1.0000 | ORAL_TABLET | Freq: Two times a day (BID) | ORAL | 0 refills | Status: DC

## 2021-10-18 NOTE — Progress Notes (Signed)
Virtual Visit Consent   [name], you are scheduled for a virtual visit with a [facility] provider today.     Just as with appointments in the office, your consent must be obtained to participate.  Your consent will be active for this visit and any virtual visit you may have with one of our providers in the next 365 days.     If you have a MyChart account, a copy of this consent can be sent to you electronically.  All virtual visits are billed to your insurance company just like a traditional visit in the office.    As this is a virtual visit, video technology does not allow for your provider to perform a traditional examination.  This may limit your provider's ability to fully assess your condition.  If your provider identifies any concerns that need to be evaluated in person or the need to arrange testing (such as labs, EKG, etc.), we will make arrangements to do so.     Although advances in technology are sophisticated, we cannot ensure that it will always work on either your end or our end.  If the connection with a video visit is poor, the visit may have to be switched to a telephone visit.  With either a video or telephone visit, we are not always able to ensure that we have a secure connection.     I need to obtain your verbal consent now.   Are you willing to proceed with your visit today?    [name] has provided verbal consent on 10/18/2021 for a virtual visit (video or telephone).   Mar Daring, PA-C   Date: 10/18/2021 9:49 AM   Virtual Visit via Video Note   I, Mar Daring, connected with  Leah Park  ([birth year], 05/21/2001) on 10/18/21 at  9:45 AM EST by a video-enabled telemedicine application and verified that I am speaking with the correct person using two identifiers.  Location: Patient: Virtual Visit Location Patient: Home Provider: Virtual Visit Location Provider: Home Office   I discussed the limitations of evaluation and management by  telemedicine and the availability of in person appointments. The patient expressed understanding and agreed to proceed.    History of Present Illness: Leah Park is a 21 y.o. who identifies as a female who was assigned female at birth, and is being seen today for suspected UTI.  HPI: Urinary Tract Infection  This is a new problem. The current episode started today. The problem has been gradually worsening. The quality of the pain is described as aching and burning. The pain is mild. There has been no fever. Associated symptoms include frequency, hesitancy and urgency. Pertinent negatives include no chills, discharge, flank pain, hematuria, nausea or vomiting. Associated symptoms comments: Dysuria. She has tried increased fluids (cranberry) for the symptoms. The treatment provided no relief. There is no history of catheterization, recurrent UTIs or a urological procedure. Has had UTIs before but not recurrent.     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   GAD (generalized anxiety disorder) 12/22/2013   MDD (major depressive disorder) 12/22/2013    Allergies: No Known Allergies Medications:  Current Outpatient Medications:    sulfamethoxazole-trimethoprim (BACTRIM DS) 800-160 MG tablet, Take 1 tablet by mouth 2 (two) times daily., Disp: 10 tablet, Rfl: 0   norethindrone-ethinyl estradiol-FE (JUNEL FE 1/20) 1-20 MG-MCG tablet, Take 1 tablet by mouth daily., Disp: 84 tablet, Rfl: 0  Observations/Objective:  Patient is well-developed, well-nourished in no acute distress.  Resting comfortably at home.  Head is normocephalic, atraumatic.  No labored breathing.  Speech is clear and coherent with logical content.  Patient is alert and oriented at baseline.    Assessment and Plan: 1. Suspected UTI - sulfamethoxazole-trimethoprim (BACTRIM DS) 800-160 MG tablet; Take 1 tablet by mouth 2 (two) times daily.  Dispense: 10 tablet; Refill: 0  -  Worsening symptoms.  - Will treat empirically with Bactrim  - May use tylenol or ibuprofen, cranberry juice, AZO tabs as needed - Continue to push fluids.  - She is to seek in person evaluation if symptoms do not improve or if they worsen.    Follow Up Instructions: I discussed the assessment and treatment plan with the patient. The patient was provided an opportunity to ask questions and all were answered. The patient agreed with the plan and demonstrated an understanding of the instructions.  A copy of instructions were sent to the patient via MyChart unless otherwise noted below.    The patient was advised to call back or seek an in-person evaluation if the symptoms worsen or if the condition fails to improve as anticipated.  Time:  I spent 10 minutes with the patient via telehealth technology discussing the above problems/concerns.    Mar Daring, PA-C

## 2021-10-18 NOTE — Patient Instructions (Signed)
[name], thank you for joining Margaretann Loveless, PA-C for today's virtual visit.  While this provider is not your primary care provider (PCP), if your PCP is located in our provider database this encounter information will be shared with them immediately following your visit.  Consent: (Patient) Leah Park provided verbal consent for this virtual visit at the beginning of the encounter.  Current Medications:  Current Outpatient Medications:    sulfamethoxazole-trimethoprim (BACTRIM DS) 800-160 MG tablet, Take 1 tablet by mouth 2 (two) times daily., Disp: 10 tablet, Rfl: 0   norethindrone-ethinyl estradiol-FE (JUNEL FE 1/20) 1-20 MG-MCG tablet, Take 1 tablet by mouth daily., Disp: 84 tablet, Rfl: 0   Medications ordered in this encounter:  Meds ordered this encounter  Medications   sulfamethoxazole-trimethoprim (BACTRIM DS) 800-160 MG tablet    Sig: Take 1 tablet by mouth 2 (two) times daily.    Dispense:  10 tablet    Refill:  0    Order Specific Question:   Supervising Provider    Answer:   Hyacinth Meeker, BRIAN [[id number]]     *If you need refills on other medications prior to your next appointment, please contact your pharmacy*  Follow-Up: Call back or seek an in-person evaluation if the symptoms worsen or if the condition fails to improve as anticipated.  Other Instructions Urinary Tract Infection, Adult A urinary tract infection (UTI) is an infection of any part of the urinary tract. The urinary tract includes the kidneys, ureters, bladder, and urethra. These organs make, store, and get rid of urine in the body. An upper UTI affects the ureters and kidneys. A lower UTI affects the bladder and urethra. What are the causes? Most urinary tract infections are caused by bacteria in your genital area around your urethra, where urine leaves your body. These bacteria grow and cause inflammation of your urinary tract. What increases the risk? You are more likely to develop this  condition if: You have a urinary catheter that stays in place. You are not able to control when you urinate or have a bowel movement (incontinence). You are female and you: Use a spermicide or diaphragm for birth control. Have low estrogen levels. Are pregnant. You have certain genes that increase your risk. You are sexually active. You take antibiotic medicines. You have a condition that causes your flow of urine to slow down, such as: An enlarged prostate, if you are female. Blockage in your urethra. A kidney stone. A nerve condition that affects your bladder control (neurogenic bladder). Not getting enough to drink, or not urinating often. You have certain medical conditions, such as: Diabetes. A weak disease-fighting system (immunesystem). Sickle cell disease. Gout. Spinal cord injury. What are the signs or symptoms? Symptoms of this condition include: Needing to urinate right away (urgency). Frequent urination. This may include small amounts of urine each time you urinate. Pain or burning with urination. Blood in the urine. Urine that smells bad or unusual. Trouble urinating. Cloudy urine. Vaginal discharge, if you are female. Pain in the abdomen or the lower back. You may also have: Vomiting or a decreased appetite. Confusion. Irritability or tiredness. A fever or chills. Diarrhea. The first symptom in older adults may be confusion. In some cases, they may not have any symptoms until the infection has worsened. How is this diagnosed? This condition is diagnosed based on your medical history and a physical exam. You may also have other tests, including: Urine tests. Blood tests. Tests for STIs (sexually transmitted infections). If you have  had more than one UTI, a cystoscopy or imaging studies may be done to determine the cause of the infections. How is this treated? Treatment for this condition includes: Antibiotic medicine. Over-the-counter medicines to treat  discomfort. Drinking enough water to stay hydrated. If you have frequent infections or have other conditions such as a kidney stone, you may need to see a health care provider who specializes in the urinary tract (urologist). In rare cases, urinary tract infections can cause sepsis. Sepsis is a life-threatening condition that occurs when the body responds to an infection. Sepsis is treated in the hospital with IV antibiotics, fluids, and other medicines. Follow these instructions at home: Medicines Take over-the-counter and prescription medicines only as told by your health care provider. If you were prescribed an antibiotic medicine, take it as told by your health care provider. Do not stop using the antibiotic even if you start to feel better. General instructions Make sure you: Empty your bladder often and completely. Do not hold urine for long periods of time. Empty your bladder after sex. Wipe from front to back after urinating or having a bowel movement if you are female. Use each tissue only one time when you wipe. Drink enough fluid to keep your urine pale yellow. Keep all follow-up visits. This is important. Contact a health care provider if: Your symptoms do not get better after 1-2 days. Your symptoms go away and then return. Get help right away if: You have severe pain in your back or your lower abdomen. You have a fever or chills. You have nausea or vomiting. Summary A urinary tract infection (UTI) is an infection of any part of the urinary tract, which includes the kidneys, ureters, bladder, and urethra. Most urinary tract infections are caused by bacteria in your genital area. Treatment for this condition often includes antibiotic medicines. If you were prescribed an antibiotic medicine, take it as told by your health care provider. Do not stop using the antibiotic even if you start to feel better. Keep all follow-up visits. This is important. This information is not  intended to replace advice given to you by your health care provider. Make sure you discuss any questions you have with your health care provider. Document Revised: 04/20/2020 Document Reviewed: 04/20/2020 Elsevier Patient Education  2022 [employer].    If you have been instructed to have an in-person evaluation today at a local Urgent Care facility, please use the link below. It will take you to a list of all of our available [facility], including address, phone number and hours of operation. Please do not delay care.  [facility] [place] [facility]  If you or a family member do not have a primary care provider, use the link below to schedule a visit and establish care. When you choose a [facility] primary care physician or advanced practice provider, you gain a long-term partner in health. Find a Primary Care Provider  Learn more about [facility] [place] [place]'s in-office and virtual care options: [facility] - Get Care Now

## 2021-11-22 DIAGNOSIS — F419 Anxiety disorder, unspecified: Secondary | ICD-10-CM | POA: Diagnosis not present

## 2021-12-12 DIAGNOSIS — F411 Generalized anxiety disorder: Secondary | ICD-10-CM | POA: Diagnosis not present

## 2021-12-23 DIAGNOSIS — F411 Generalized anxiety disorder: Secondary | ICD-10-CM | POA: Diagnosis not present

## 2021-12-23 DIAGNOSIS — F331 Major depressive disorder, recurrent, moderate: Secondary | ICD-10-CM | POA: Diagnosis not present

## 2022-01-14 DIAGNOSIS — F411 Generalized anxiety disorder: Secondary | ICD-10-CM | POA: Diagnosis not present

## 2022-01-22 DIAGNOSIS — F411 Generalized anxiety disorder: Secondary | ICD-10-CM | POA: Diagnosis not present

## 2022-01-22 DIAGNOSIS — F331 Major depressive disorder, recurrent, moderate: Secondary | ICD-10-CM | POA: Diagnosis not present

## 2022-01-28 DIAGNOSIS — F411 Generalized anxiety disorder: Secondary | ICD-10-CM | POA: Diagnosis not present

## 2022-02-18 DIAGNOSIS — F411 Generalized anxiety disorder: Secondary | ICD-10-CM | POA: Diagnosis not present

## 2022-03-07 DIAGNOSIS — F411 Generalized anxiety disorder: Secondary | ICD-10-CM | POA: Diagnosis not present

## 2022-03-07 DIAGNOSIS — F331 Major depressive disorder, recurrent, moderate: Secondary | ICD-10-CM | POA: Diagnosis not present

## 2022-03-10 DIAGNOSIS — F411 Generalized anxiety disorder: Secondary | ICD-10-CM | POA: Diagnosis not present

## 2022-03-26 DIAGNOSIS — F411 Generalized anxiety disorder: Secondary | ICD-10-CM | POA: Diagnosis not present

## 2022-04-21 DIAGNOSIS — F411 Generalized anxiety disorder: Secondary | ICD-10-CM | POA: Diagnosis not present

## 2022-06-02 DIAGNOSIS — F4010 Social phobia, unspecified: Secondary | ICD-10-CM | POA: Diagnosis not present

## 2022-09-10 ENCOUNTER — Encounter: Payer: Self-pay | Admitting: Internal Medicine

## 2022-09-10 ENCOUNTER — Telehealth: Payer: Federal, State, Local not specified - PPO | Admitting: Internal Medicine

## 2022-09-10 VITALS — Ht 69.5 in | Wt 145.0 lb

## 2022-09-10 DIAGNOSIS — U071 COVID-19: Secondary | ICD-10-CM

## 2022-09-10 MED ORDER — MOLNUPIRAVIR EUA 200MG CAPSULE: 4.0000 | ORAL_CAPSULE | Freq: Two times a day (BID) | ORAL | 0 refills | Status: AC

## 2022-09-10 NOTE — Progress Notes (Unsigned)
   Subjective:    Patient ID: Leah Park, female    DOB: 2000/09/27, 21 y.o.   MRN: [id number]  DOS:  09/10/2022 Type of visit - description: Virtual Visit via Video Note  I connected with the above patient  by a video enabled telemedicine application and verified that I am speaking with the correct person using two identifiers.   Location of patient: home  Location of provider: office  Persons participating in the virtual visit: patient, provider   I discussed the limitations of evaluation and management by telemedicine and the availability of in person appointments. The patient expressed understanding and agreed to proceed.  Acute  Symptoms started 2 days ago. Started with a mild sore throat and sinus congestion. Subsequently developed headache and achiness. + Subjective fever but she has checked her temperature and no fever. Denies any nausea vomiting. Has mild cough. No rash.    Review of Systems See above   Past Medical History:  Diagnosis Date   Anxiety    Depression     Past Surgical History:  Procedure Laterality Date   THYROID CYST EXCISION      Current Outpatient Medications  Medication Instructions   buPROPion ER (WELLBUTRIN SR) 100 MG 12 hr tablet 1 tablet, Oral, Daily   molnupiravir EUA (LAGEVRIO) 800 mg, Oral, 2 times daily   norethindrone-ethinyl estradiol-FE (JUNEL FE 1/20) 1-20 MG-MCG tablet 1 tablet, Oral, Daily       Objective:   Physical Exam Ht 5' 9.5" (1.765 m)   Wt 145 lb (65.8 kg)   LMP 08/27/2022 (Approximate)   BMI 21.11 kg/m  Is a virtual video visit, alert oriented x 3, in no distress.  No cough, speaking in complete sentences    Assessment    COVID-19: The patient is 21 and healthy, on birth control pills. Symptoms started 2 days ago, tested positive today. She request medication, we had extensive discussion about molnupiravir versus Paxlovid.  She elected Paxlovid however we don't have  a recent BMP. I gave her the  option to come to the office and check a  STAT BMP but she declines. Plan: Molnupiravir. UPT before starting medication. Rest, fluids, Flonase.  Message with instructions sent   I discussed the assessment and treatment plan with the patient. The patient was provided an opportunity to ask questions and all were answered. The patient agreed with the plan and demonstrated an understanding of the instructions.   The patient was advised to call back or seek an in-person evaluation if the symptoms worsen or if the condition fails to improve as anticipated.

## 2022-09-29 DIAGNOSIS — F4010 Social phobia, unspecified: Secondary | ICD-10-CM | POA: Diagnosis not present

## 2022-10-13 DIAGNOSIS — F4010 Social phobia, unspecified: Secondary | ICD-10-CM | POA: Diagnosis not present

## 2022-11-03 DIAGNOSIS — F4010 Social phobia, unspecified: Secondary | ICD-10-CM | POA: Diagnosis not present

## 2022-11-03 DIAGNOSIS — Z630 Problems in relationship with spouse or partner: Secondary | ICD-10-CM | POA: Diagnosis not present

## 2022-11-13 ENCOUNTER — Telehealth: Payer: Federal, State, Local not specified - PPO | Admitting: Physician Assistant

## 2022-11-13 DIAGNOSIS — J0110 Acute frontal sinusitis, unspecified: Secondary | ICD-10-CM | POA: Diagnosis not present

## 2022-11-13 MED ORDER — PREDNISONE 20 MG PO TABS: 40.0000 mg | ORAL_TABLET | Freq: Every day | ORAL | 0 refills | Status: AC

## 2022-11-13 MED ORDER — FLUTICASONE PROPIONATE 50 MCG/ACT NA SUSP: 2.0000 | Freq: Every day | NASAL | 0 refills | Status: DC

## 2022-11-13 NOTE — Patient Instructions (Signed)
  Lazarus Gowda, thank you for joining Leeanne Rio, PA-C for today's virtual visit.  While this provider is not your primary care provider (PCP), if your PCP is located in our provider database this encounter information will be shared with them immediately following your visit.   [facility] account gives you access to today's visit and all your visits, tests, and labs performed at [facility] " click here if you don't have a [facility] account or go to [email or web address]  Consent: (Patient) Leah Park provided verbal consent for this virtual visit at the beginning of the encounter.  Current Medications:  Current Outpatient Medications:    buPROPion ER (WELLBUTRIN SR) 100 MG 12 hr tablet, Take 1 tablet by mouth daily., Disp: , Rfl:    norethindrone-ethinyl estradiol-FE (JUNEL FE 1/20) 1-20 MG-MCG tablet, Take 1 tablet by mouth daily., Disp: 84 tablet, Rfl: 0   Medications ordered in this encounter:  No orders of the defined types were placed in this encounter.    *If you need refills on other medications prior to your next appointment, please contact your pharmacy*  Follow-Up: Call back or seek an in-person evaluation if the symptoms worsen or if the condition fails to improve as anticipated.  [place] [phone]  Other Instructions Please keep hydrated and rest. Start a saline rinse.  Ok to use Tylenol Cold and Sinus OTC. Start the [facility] as directed. Take the prednisone as directed. Follow-up in person if not resolving or any new/worsening symptoms despite treatment.    If you have been instructed to have an in-person evaluation today at a local Urgent Care facility, please use the link below. It will take you to a list of all of our available [place], including address, phone number and hours of operation. Please do not delay care.  [facility]  If you or a family member do  not have a primary care provider, use the link below to schedule a visit and establish care. When you choose a [place] primary care physician or advanced practice provider, you gain a long-term partner in health. Find a Primary Care Provider  Learn more about [place]'s in-office and virtual care options: [facility] Now

## 2022-11-13 NOTE — Progress Notes (Signed)
Virtual Visit Consent   Leah Park, you are scheduled for a virtual visit with a [facility] [place] provider today. Just as with appointments in the office, your consent must be obtained to participate. Your consent will be active for this visit and any virtual visit you may have with one of our providers in the next 365 days. If you have a MyChart account, a copy of this consent can be sent to you electronically.  As this is a virtual visit, video technology does not allow for your provider to perform a traditional examination. This may limit your provider's ability to fully assess your condition. If your provider identifies any concerns that need to be evaluated in person or the need to arrange testing (such as labs, EKG, etc.), we will make arrangements to do so. Although advances in technology are sophisticated, we cannot ensure that it will always work on either your end or our end. If the connection with a video visit is poor, the visit may have to be switched to a telephone visit. With either a video or telephone visit, we are not always able to ensure that we have a secure connection.  By engaging in this virtual visit, you consent to the provision of healthcare and authorize for your insurance to be billed (if applicable) for the services provided during this visit. Depending on your insurance coverage, you may receive a charge related to this service.  I need to obtain your verbal consent now. Are you willing to proceed with your visit today? Leah Park has provided verbal consent on 11/13/2022 for a virtual visit (video or telephone). Leeanne Rio, [place]  Date: 11/13/2022 4:24 PM  Virtual Visit via Video Note   I, Leeanne Rio, connected with  Leah Park  ([id number], July 18, 2000) on 11/13/22 at  4:00 PM EST by a video-enabled telemedicine application and verified that I am speaking with the correct person using two identifiers.  Location: Patient: Virtual Visit Location  Patient: Home Provider: Virtual Visit Location Provider: Home Office   I discussed the limitations of evaluation and management by telemedicine and the availability of in person appointments. The patient expressed understanding and agreed to proceed.    History of Present Illness: Leah Park is a 22 y.o. who identifies as a female who was assigned female at birth, and is being seen today for symptoms starting yesterday with sinus headache, nasal congestion, mild nausea and some fatigue. Thought she would sleep and wake up feeling better but symptoms have continued today. Notes she has been dealing with some ongoing nasal congestion intermittently for the past few weeks with sinus pressure but has not had the headache or fatigue . Denies fever but occasionally feels warm.   OTC -- this AM took Mucinex. Excedrin .    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   GAD (generalized anxiety disorder) 12/22/2013   MDD (major depressive disorder) 12/22/2013    Allergies: No Known Allergies Medications:  Current Outpatient Medications:    fluticasone (FLONASE) 50 MCG/ACT nasal spray, Place 2 sprays into both nostrils daily., Disp: 16 g, Rfl: 0   predniSONE (DELTASONE) 20 MG tablet, Take 2 tablets (40 mg total) by mouth daily with breakfast for 3 days., Disp: 6 tablet, Rfl: 0   buPROPion ER (WELLBUTRIN SR) 100 MG 12 hr tablet, Take 1 tablet by mouth daily., Disp: , Rfl:    norethindrone-ethinyl estradiol-FE (JUNEL  FE 1/20) 1-20 MG-MCG tablet, Take 1 tablet by mouth daily., Disp: 84 tablet, Rfl: 0  Observations/Objective: Patient is well-developed, well-nourished in no acute distress.  Resting comfortably at home.  Head is normocephalic, atraumatic.  No labored breathing. Speech is clear and coherent with logical content.  Patient is alert and oriented at baseline.   Assessment and Plan: 1. Acute non-recurrent frontal  sinusitis - fluticasone (FLONASE) 50 MCG/ACT nasal spray; Place 2 sprays into both nostrils daily.  Dispense: 16 g; Refill: 0 - predniSONE (DELTASONE) 20 MG tablet; Take 2 tablets (40 mg total) by mouth daily with breakfast for 3 days.  Dispense: 6 tablet; Refill: 0  Clear drainage. Suspect developed a full blown viral sinusitis. No indication for antibiotics at present time. Supportive measures and OTC medications reviewed. Start Flonase as directed. Will I've 3-day burst of prednisone to reduce sinus inflammation and hopefully break headache cycle for her.   Follow Up Instructions: I discussed the assessment and treatment plan with the patient. The patient was provided an opportunity to ask questions and all were answered. The patient agreed with the plan and demonstrated an understanding of the instructions.  A copy of instructions were sent to the patient via MyChart unless otherwise noted below.  The patient was advised to call back or seek an in-person evaluation if the symptoms worsen or if the condition fails to improve as anticipated.  Time:  I spent 10 minutes with the patient via telehealth technology discussing the above problems/concerns.    Leeanne Rio, PA-C

## 2022-12-08 DIAGNOSIS — F4010 Social phobia, unspecified: Secondary | ICD-10-CM | POA: Diagnosis not present

## 2023-01-06 DIAGNOSIS — Z01419 Encounter for gynecological examination (general) (routine) without abnormal findings: Secondary | ICD-10-CM | POA: Diagnosis not present

## 2023-01-06 DIAGNOSIS — Z124 Encounter for screening for malignant neoplasm of cervix: Secondary | ICD-10-CM | POA: Diagnosis not present

## 2023-01-06 DIAGNOSIS — Z113 Encounter for screening for infections with a predominantly sexual mode of transmission: Secondary | ICD-10-CM | POA: Diagnosis not present

## 2023-01-24 ENCOUNTER — Telehealth: Payer: Federal, State, Local not specified - PPO | Admitting: Nurse Practitioner

## 2023-01-24 DIAGNOSIS — J029 Acute pharyngitis, unspecified: Secondary | ICD-10-CM | POA: Diagnosis not present

## 2023-01-24 MED ORDER — AMOXICILLIN-POT CLAVULANATE 875-125 MG PO TABS: 1.0000 | ORAL_TABLET | Freq: Two times a day (BID) | ORAL | 0 refills | Status: DC

## 2023-01-24 NOTE — Patient Instructions (Signed)
  Pricilla Holm, thank you for joining Bennie Pierini, FNP for today's virtual visit.  While this provider is not your primary care provider (PCP), if your PCP is located in our provider database this encounter information will be shared with them immediately following your visit.   A [facility] MyChart account gives you access to today's visit and all your visits, tests, and labs performed at [facility] " click here if you don't have a [facility] MyChart account or go to [email or web address]  Consent: (Patient) GARNA ROTUNDO provided verbal consent for this virtual visit at the beginning of the encounter.  Current Medications:  Current Outpatient Medications:    amoxicillin-clavulanate (AUGMENTIN) 875-125 MG tablet, Take 1 tablet by mouth 2 (two) times daily., Disp: 14 tablet, Rfl: 0   buPROPion ER (WELLBUTRIN SR) 100 MG 12 hr tablet, Take 1 tablet by mouth daily., Disp: , Rfl:    fluticasone (FLONASE) 50 MCG/ACT nasal spray, Place 2 sprays into both nostrils daily., Disp: 16 g, Rfl: 0   norethindrone-ethinyl estradiol-FE (JUNEL FE 1/20) 1-20 MG-MCG tablet, Take 1 tablet by mouth daily., Disp: 84 tablet, Rfl: 0   Medications ordered in this encounter:  Meds ordered this encounter  Medications   amoxicillin-clavulanate (AUGMENTIN) 875-125 MG tablet    Sig: Take 1 tablet by mouth 2 (two) times daily.    Dispense:  14 tablet    Refill:  0    Order Specific Question:   Supervising Provider    Answer:   Merrilee Jansky [id number]     *If you need refills on other medications prior to your next appointment, please contact your pharmacy*  Follow-Up: Call back or seek an in-person evaluation if the symptoms worsen or if the condition fails to improve as anticipated.  [facility] [phone]  Other Instructions Force fluids Motrin or tylenol OTC OTC decongestant Throat lozenges if help New toothbrush in 3 days    If you have been  instructed to have an in-person evaluation today at a local Urgent Care facility, please use the link below. It will take you to a list of all of our available [place] [facility], including address, phone number and hours of operation. Please do not delay care.  [place]  If you or a family member do not have a primary care provider, use the link below to schedule a visit and establish care. When you choose a [place] primary care physician or advanced practice provider, you gain a long-term partner in health. Find a Primary Care Provider  Learn more about [place]'s in-office and virtual care options: [place] - Get Care Now

## 2023-01-24 NOTE — Progress Notes (Signed)
Virtual Visit Consent   Leah Park, you are scheduled for a virtual visit with Mary-Margaret Daphine Deutscher, FNP, a [facility] provider, today.     Just as with appointments in the office, your consent must be obtained to participate.  Your consent will be active for this visit and any virtual visit you may have with one of our providers in the next 365 days.     If you have a MyChart account, a copy of this consent can be sent to you electronically.  All virtual visits are billed to your insurance company just like a traditional visit in the office.    As this is a virtual visit, video technology does not allow for your provider to perform a traditional examination.  This may limit your provider's ability to fully assess your condition.  If your provider identifies any concerns that need to be evaluated in person or the need to arrange testing (such as labs, EKG, etc.), we will make arrangements to do so.     Although advances in technology are sophisticated, we cannot ensure that it will always work on either your end or our end.  If the connection with a video visit is poor, the visit may have to be switched to a telephone visit.  With either a video or telephone visit, we are not always able to ensure that we have a secure connection.     I need to obtain your verbal consent now.   Are you willing to proceed with your visit today? YES   OTHA CARLYLE has provided verbal consent on 01/24/2023 for a virtual visit (video or telephone).   Mary-Margaret Daphine Deutscher, FNP   Date: 01/24/2023 5:48 PM   Virtual Visit via Video Note   I, Mary-Margaret Briany Aye, connected with Leah Park ([id number], June 28, 2001) on 01/24/23 at  6:00 PM EDT by a video-enabled telemedicine application and verified that I am speaking with the correct person using two identifiers.  Location: Patient: Virtual Visit Location Patient: Home Provider: Virtual Visit Location Provider: Mobile   I discussed the limitations of  evaluation and management by telemedicine and the availability of in person appointments. The patient expressed understanding and agreed to proceed.    History of Present Illness: Leah Park is a 22 y.o. who identifies as a female who was assigned female at birth, and is being seen today for sore throat.  HPI: Strated with headache yesterday, thought was coming from weather change. Her throat started hurt on right side yesterday and has continued in to the day. She has been using cough drops. She had some left of augmentin and took one of those.     Review of Systems  Constitutional:  Negative for chills and fever.  HENT:  Positive for sore throat. Negative for congestion.   Cardiovascular: Negative.   Neurological:  Positive for headaches. Negative for dizziness.    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  Resting comfortably  at home.  Head is normocephalic, atraumatic.  No labored breathing.  Speech is clear and coherent with logical content.  Patient is alert and oriented at baseline.  Raspy voice No lymphademopathy Right side of pharynx erythematous  Assessment and Plan:  LEMIA LANFORD in today with chief complaint of Sore Throat   1. Pharyngitis, unspecified etiology Force fluids Motrin or tylenol OTC OTC  decongestant Throat lozenges if help New toothbrush in 3 days  Meds ordered this encounter  Medications   amoxicillin-clavulanate (AUGMENTIN) 875-125 MG tablet    Sig: Take 1 tablet by mouth 2 (two) times daily.    Dispense:  14 tablet    Refill:  0    Order Specific Question:   Supervising Provider    Answer:   Merrilee Jansky [id number]       The above assessment and management plan was discussed with the patient. The patient verbalized understanding of and has agreed to the management plan. Patient is aware to call the clinic if symptoms persist or worsen. Patient is aware when to return to the clinic for a follow-up visit. Patient educated on when it is appropriate to go to the emergency department.   Mary-Margaret Daphine Deutscher, FNP    Follow Up Instructions: I discussed the assessment and treatment plan with the patient. The patient was provided an opportunity to ask questions and all were answered. The patient agreed with the plan and demonstrated an understanding of the instructions.  A copy of instructions were sent to the patient via MyChart.  The patient was advised to call back or seek an in-person evaluation if the symptoms worsen or if the condition fails to improve as anticipated.  Time:  I spent 7 minutes with the patient via telehealth technology discussing the above problems/concerns.    Mary-Margaret Daphine Deutscher, FNP

## 2023-04-11 ENCOUNTER — Telehealth: Payer: Federal, State, Local not specified - PPO | Admitting: Family Medicine

## 2023-04-11 DIAGNOSIS — K5900 Constipation, unspecified: Secondary | ICD-10-CM

## 2023-04-11 NOTE — Patient Instructions (Signed)

## 2023-04-11 NOTE — Progress Notes (Signed)
Virtual Visit Consent   Leah Park, you are scheduled for a virtual visit with a [facility] provider today. Just as with appointments in the office, your consent must be obtained to participate. Your consent will be active for this visit and any virtual visit you may have with one of our providers in the next 365 days. If you have a MyChart account, a copy of this consent can be sent to you electronically.  As this is a virtual visit, video technology does not allow for your provider to perform a traditional examination. This may limit your provider's ability to fully assess your condition. If your provider identifies any concerns that need to be evaluated in person or the need to arrange testing (such as labs, EKG, etc.), we will make arrangements to do so. Although advances in technology are sophisticated, we cannot ensure that it will always work on either your end or our end. If the connection with a video visit is poor, the visit may have to be switched to a telephone visit. With either a video or telephone visit, we are not always able to ensure that we have a secure connection.  By engaging in this virtual visit, you consent to the provision of healthcare and authorize for your insurance to be billed (if applicable) for the services provided during this visit. Depending on your insurance coverage, you may receive a charge related to this service.  I need to obtain your verbal consent now. Are you willing to proceed with your visit today? Leah Park has provided verbal consent on 04/11/2023 for a virtual visit (video or telephone). Leah Curio, FNP  Date: 04/11/2023 10:24 AM  Virtual Visit via Video Note   I, Leah Park, connected with  Leah Park  ([id number], Oct 20, 2000) on 04/11/23 at 10:30 AM EDT by a video-enabled telemedicine application and verified that I am speaking with the correct person using two identifiers.  Location: Patient: Virtual Visit Location Patient:  Home Provider: Virtual Visit Location Provider: Home Office   I discussed the limitations of evaluation and management by telemedicine and the availability of in person appointments. The patient expressed understanding and agreed to proceed.    History of Present Illness: Leah Park is a 22 y.o. who identifies as a female who was assigned female at birth, and is being seen today for constipation following rhinoplasty last week. No BM in 3 weeks. She has tried stool softener and miralax. No abd pain or fever. She is bloated. 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   GAD (generalized anxiety disorder) 12/22/2013   MDD (major depressive disorder) 12/22/2013    Allergies: No Known Allergies Medications:  Current Outpatient Medications:    amoxicillin-clavulanate (AUGMENTIN) 875-125 MG tablet, Take 1 tablet by mouth 2 (two) times daily., Disp: 14 tablet, Rfl: 0   buPROPion ER (WELLBUTRIN SR) 100 MG 12 hr tablet, Take 1 tablet by mouth daily., Disp: , Rfl:    fluticasone (FLONASE) 50 MCG/ACT nasal spray, Place 2 sprays into both nostrils daily., Disp: 16 g, Rfl: 0   norethindrone-ethinyl estradiol-FE (JUNEL FE 1/20) 1-20 MG-MCG tablet, Take 1 tablet by mouth daily., Disp: 84 tablet, Rfl: 0  Observations/Objective: Patient is well-developed, well-nourished in no acute distress.  Resting comfortably  at home.  Head is normocephalic, atraumatic.  No labored breathing.  Speech is clear and coherent with logical content.  Patient is alert and  oriented at baseline.    Assessment and Plan: 1. Constipation, unspecified constipation type  Stool softeners, miralax and progress as needed. Increase fluids, massage, exercise, UC if sx persist or worsen.   Follow Up Instructions: I discussed the assessment and treatment plan with the patient. The patient was provided an opportunity to ask questions and all were answered. The  patient agreed with the plan and demonstrated an understanding of the instructions.  A copy of instructions were sent to the patient via MyChart unless otherwise noted below.     The patient was advised to call back or seek an in-person evaluation if the symptoms worsen or if the condition fails to improve as anticipated.  Time:  I spent 10 minutes with the patient via telehealth technology discussing the above problems/concerns.    Leah Curio, FNP

## 2023-06-12 DIAGNOSIS — F411 Generalized anxiety disorder: Secondary | ICD-10-CM | POA: Diagnosis not present

## 2023-06-12 DIAGNOSIS — F331 Major depressive disorder, recurrent, moderate: Secondary | ICD-10-CM | POA: Diagnosis not present

## 2023-08-03 DIAGNOSIS — F411 Generalized anxiety disorder: Secondary | ICD-10-CM | POA: Diagnosis not present

## 2023-08-03 DIAGNOSIS — F331 Major depressive disorder, recurrent, moderate: Secondary | ICD-10-CM | POA: Diagnosis not present

## 2023-09-21 ENCOUNTER — Telehealth: Payer: Self-pay | Admitting: Family Medicine

## 2023-09-21 NOTE — Telephone Encounter (Signed)
Called patient to advise she is overdue for her physical. Pt not in front of her computer so would like to call back to schedule appt.

## 2023-10-12 DIAGNOSIS — F331 Major depressive disorder, recurrent, moderate: Secondary | ICD-10-CM | POA: Diagnosis not present

## 2023-10-12 DIAGNOSIS — F411 Generalized anxiety disorder: Secondary | ICD-10-CM | POA: Diagnosis not present

## 2024-01-01 ENCOUNTER — Telehealth: Admitting: Physician Assistant

## 2024-01-01 DIAGNOSIS — R051 Acute cough: Secondary | ICD-10-CM

## 2024-01-01 DIAGNOSIS — B9689 Other specified bacterial agents as the cause of diseases classified elsewhere: Secondary | ICD-10-CM

## 2024-01-01 DIAGNOSIS — J019 Acute sinusitis, unspecified: Secondary | ICD-10-CM | POA: Diagnosis not present

## 2024-01-01 MED ORDER — PSEUDOEPH-BROMPHEN-DM 30-2-10 MG/5ML PO SYRP: 5.0000 mL | ORAL_SOLUTION | Freq: Four times a day (QID) | ORAL | 0 refills | Status: DC | PRN

## 2024-01-01 MED ORDER — AMOXICILLIN-POT CLAVULANATE 875-125 MG PO TABS: 1.0000 | ORAL_TABLET | Freq: Two times a day (BID) | ORAL | 0 refills | Status: DC

## 2024-01-01 NOTE — Progress Notes (Signed)
 Virtual Visit Consent   Leah Park, you are scheduled for a virtual visit with a [facility] provider today. Just as with appointments in the office, your consent must be obtained to participate. Your consent will be active for this visit and any virtual visit you may have with one of our providers in the next 365 days. If you have a MyChart account, a copy of this consent can be sent to you electronically.  As this is a virtual visit, video technology does not allow for your provider to perform a traditional examination. This may limit your provider's ability to fully assess your condition. If your provider identifies any concerns that need to be evaluated in person or the need to arrange testing (such as labs, EKG, etc.), we will make arrangements to do so. Although advances in technology are sophisticated, we cannot ensure that it will always work on either your end or our end. If the connection with a video visit is poor, the visit may have to be switched to a telephone visit. With either a video or telephone visit, we are not always able to ensure that we have a secure connection.  By engaging in this virtual visit, you consent to the provision of healthcare and authorize for your insurance to be billed (if applicable) for the services provided during this visit. Depending on your insurance coverage, you may receive a charge related to this service.  I need to obtain your verbal consent now. Are you willing to proceed with your visit today? Leah Park has provided verbal consent on 01/01/2024 for a virtual visit (video or telephone). Margaretann Loveless, PA-C  Date: 01/01/2024 9:05 AM   Virtual Visit via Video Note   I, Margaretann Loveless, connected with  Leah Park  ([id number], 15-Dec-2000) on 01/01/24 at  9:00 AM EDT by a video-enabled telemedicine application and verified that I am speaking with the correct person using two identifiers.  Location: Patient: Virtual Visit  Location Patient: Home Provider: Virtual Visit Location Provider: Home Office   I discussed the limitations of evaluation and management by telemedicine and the availability of in person appointments. The patient expressed understanding and agreed to proceed.    History of Present Illness: Leah Park is a 23 y.o. who identifies as a female who was assigned female at birth, and is being seen today for sinus congestion and cough.  HPI: Sinusitis This is a new problem. The current episode started 1 to 4 weeks ago. The problem has been gradually worsening since onset. There has been no fever. The pain is mild. Associated symptoms include congestion, coughing, ear pain (feels clogged bilaterally, intermittent), headaches, sinus pressure and a sore throat (scratchy). Pertinent negatives include no chills, diaphoresis, hoarse voice or shortness of breath. (Rhinorrhea, post nasal drainage) Treatments tried: Mucinex, cough drops. The treatment provided no relief.     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   GAD (generalized anxiety disorder) 12/22/2013   MDD (major depressive disorder) 12/22/2013    Allergies: No Known Allergies Medications:  Current Outpatient Medications:    amoxicillin-clavulanate (AUGMENTIN) 875-125 MG tablet, Take 1 tablet by mouth 2 (two) times daily., Disp: 14 tablet, Rfl: 0   brompheniramine-pseudoephedrine-DM 30-2-10 MG/5ML syrup, Take 5 mLs by mouth 4 (four) times daily as needed., Disp: 120 mL, Rfl: 0   buPROPion ER (WELLBUTRIN SR) 100 MG 12 hr tablet, Take 1 tablet by mouth daily., Disp: ,  Rfl:    fluticasone (FLONASE) 50 MCG/ACT nasal spray, Place 2 sprays into both nostrils daily., Disp: 16 g, Rfl: 0   norethindrone-ethinyl estradiol-FE (JUNEL FE 1/20) 1-20 MG-MCG tablet, Take 1 tablet by mouth daily., Disp: 84 tablet, Rfl: 0  Observations/Objective: Patient is well-developed, well-nourished in no acute  distress.  Resting comfortably at home.  Head is normocephalic, atraumatic.  No labored breathing.  Speech is clear and coherent with logical content.  Patient is alert and oriented at baseline.    Assessment and Plan: 1. Acute bacterial sinusitis (Primary) - amoxicillin-clavulanate (AUGMENTIN) 875-125 MG tablet; Take 1 tablet by mouth 2 (two) times daily.  Dispense: 14 tablet; Refill: 0  2. Acute cough - brompheniramine-pseudoephedrine-DM 30-2-10 MG/5ML syrup; Take 5 mLs by mouth 4 (four) times daily as needed.  Dispense: 120 mL; Refill: 0  - Worsening symptoms that have not responded to OTC medications.  - Will give Augmentin - Bromfed DM added for cough - Continue allergy medications.  - Steam and humidifier can help - Stay well hydrated and get plenty of rest.  - Seek in person evaluation if no symptom improvement or if symptoms worsen   Follow Up Instructions: I discussed the assessment and treatment plan with the patient. The patient was provided an opportunity to ask questions and all were answered. The patient agreed with the plan and demonstrated an understanding of the instructions.  A copy of instructions were sent to the patient via MyChart unless otherwise noted below.    The patient was advised to call back or seek an in-person evaluation if the symptoms worsen or if the condition fails to improve as anticipated.    Margaretann Loveless, PA-C

## 2024-01-01 NOTE — Patient Instructions (Signed)
 Pricilla Holm, thank you for joining Margaretann Loveless, PA-C for today's virtual visit.  While this provider is not your primary care provider (PCP), if your PCP is located in our provider database this encounter information will be shared with them immediately following your visit.   A [facility] MyChart account gives you access to today's visit and all your visits, tests, and labs performed at [facility] " click here if you don't have a [facility] MyChart account or go to [email or web address]  Consent: (Patient) DARIELLA GILLIHAN provided verbal consent for this virtual visit at the beginning of the encounter.  Current Medications:  Current Outpatient Medications:    amoxicillin-clavulanate (AUGMENTIN) 875-125 MG tablet, Take 1 tablet by mouth 2 (two) times daily., Disp: 14 tablet, Rfl: 0   brompheniramine-pseudoephedrine-DM 30-2-10 MG/5ML syrup, Take 5 mLs by mouth 4 (four) times daily as needed., Disp: 120 mL, Rfl: 0   buPROPion ER (WELLBUTRIN SR) 100 MG 12 hr tablet, Take 1 tablet by mouth daily., Disp: , Rfl:    fluticasone (FLONASE) 50 MCG/ACT nasal spray, Place 2 sprays into both nostrils daily., Disp: 16 g, Rfl: 0   norethindrone-ethinyl estradiol-FE (JUNEL FE 1/20) 1-20 MG-MCG tablet, Take 1 tablet by mouth daily., Disp: 84 tablet, Rfl: 0   Medications ordered in this encounter:  Meds ordered this encounter  Medications   amoxicillin-clavulanate (AUGMENTIN) 875-125 MG tablet    Sig: Take 1 tablet by mouth 2 (two) times daily.    Dispense:  14 tablet    Refill:  0    Supervising Provider:   Merrilee Jansky [[id number]]   brompheniramine-pseudoephedrine-DM 30-2-10 MG/5ML syrup    Sig: Take 5 mLs by mouth 4 (four) times daily as needed.    Dispense:  120 mL    Refill:  0    Supervising Provider:   Merrilee Jansky [[id number]]     *If you need refills on other medications prior to your next appointment, please contact your pharmacy*  Follow-Up: Call back  or seek an in-person evaluation if the symptoms worsen or if the condition fails to improve as anticipated.   Virtual Care [phone]  Other Instructions Sinus Infection, Adult A sinus infection, also called sinusitis, is inflammation of your sinuses. Sinuses are hollow spaces in the bones around your face. Your sinuses are located: Around your eyes. In the middle of your forehead. Behind your nose. In your cheekbones. Mucus normally drains out of your sinuses. When your nasal tissues become inflamed or swollen, mucus can become trapped or blocked. This allows bacteria, viruses, and fungi to grow, which leads to infection. Most infections of the sinuses are caused by a virus. A sinus infection can develop quickly. It can last for up to 4 weeks (acute) or for more than 12 weeks (chronic). A sinus infection often develops after a cold. What are the causes? This condition is caused by anything that creates swelling in the sinuses or stops mucus from draining. This includes: Allergies. Asthma. Infection from bacteria or viruses. Deformities or blockages in your nose or sinuses. Abnormal growths in the nose (nasal polyps). Pollutants, such as chemicals or irritants in the air. Infection from fungi. This is rare. What increases the risk? You are more likely to develop this condition if you: Have a weak body defense system (immune system). Do a lot of swimming or diving. Overuse nasal sprays. Smoke. What are the signs or symptoms? The main symptoms of this condition are pain and a  feeling of pressure around the affected sinuses. Other symptoms include: Stuffy nose or congestion that makes it difficult to breathe through your nose. Thick yellow or greenish drainage from your nose. Tenderness, swelling, and warmth over the affected sinuses. A cough that may get worse at night. Decreased sense of smell and taste. Extra mucus that collects in the throat or the back of the nose  (postnasal drip) causing a sore throat or bad breath. Tiredness (fatigue). Fever. How is this diagnosed? This condition is diagnosed based on: Your symptoms. Your medical history. A physical exam. Tests to find out if your condition is acute or chronic. This may include: Checking your nose for nasal polyps. Viewing your sinuses using a device that has a light (endoscope). Testing for allergies or bacteria. Imaging tests, such as an MRI or CT scan. In rare cases, a bone biopsy may be done to rule out more serious types of fungal sinus disease. How is this treated? Treatment for a sinus infection depends on the cause and whether your condition is chronic or acute. If caused by a virus, your symptoms should go away on their own within 10 days. You may be given medicines to relieve symptoms. They include: Medicines that shrink swollen nasal passages (decongestants). A spray that eases inflammation of the nostrils (topical intranasal corticosteroids). Rinses that help get rid of thick mucus in your nose (nasal saline washes). Medicines that treat allergies (antihistamines). Over-the-counter pain relievers. If caused by bacteria, your health care provider may recommend waiting to see if your symptoms improve. Most bacterial infections will get better without antibiotic medicine. You may be given antibiotics if you have: A severe infection. A weak immune system. If caused by narrow nasal passages or nasal polyps, surgery may be needed. Follow these instructions at home: Medicines Take, use, or apply over-the-counter and prescription medicines only as told by your health care provider. These may include nasal sprays. If you were prescribed an antibiotic medicine, take it as told by your health care provider. Do not stop taking the antibiotic even if you start to feel better. Hydrate and humidify  Drink enough fluid to keep your urine pale yellow. Staying hydrated will help to thin your  mucus. Use a cool mist humidifier to keep the humidity level in your home above 50%. Inhale steam for 10-15 minutes, 3-4 times a day, or as told by your health care provider. You can do this in the bathroom while a hot shower is running. Limit your exposure to cool or dry air. Rest Rest as much as possible. Sleep with your head raised (elevated). Make sure you get enough sleep each night. General instructions  Apply a warm, moist washcloth to your face 3-4 times a day or as told by your health care provider. This will help with discomfort. Use nasal saline washes as often as told by your health care provider. Wash your hands often with soap and water to reduce your exposure to germs. If soap and water are not available, use hand sanitizer. Do not smoke. Avoid being around people who are smoking (secondhand smoke). Keep all follow-up visits. This is important. Contact a health care provider if: You have a fever. Your symptoms get worse. Your symptoms do not improve within 10 days. Get help right away if: You have a severe headache. You have persistent vomiting. You have severe pain or swelling around your face or eyes. You have vision problems. You develop confusion. Your neck is stiff. You have trouble breathing. These  symptoms may be an emergency. Get help right away. Call 911. Do not wait to see if the symptoms will go away. Do not drive yourself to the hospital. Summary A sinus infection is soreness and inflammation of your sinuses. Sinuses are hollow spaces in the bones around your face. This condition is caused by nasal tissues that become inflamed or swollen. The swelling traps or blocks the flow of mucus. This allows bacteria, viruses, and fungi to grow, which leads to infection. If you were prescribed an antibiotic medicine, take it as told by your health care provider. Do not stop taking the antibiotic even if you start to feel better. Keep all follow-up visits. This is  important. This information is not intended to replace advice given to you by your health care provider. Make sure you discuss any questions you have with your health care provider. Document Revised: 08/13/2021 Document Reviewed: 08/13/2021 Elsevier Patient Education  2024 [employer]   If you have been instructed to have an in-person evaluation today at a local Urgent Care facility, please use the link below. It will take you to a list of all of our available [facility], including address, phone number and hours of operation. Please do not delay care.  [facility]  If you or a family member do not have a primary care provider, use the link below to schedule a visit and establish care. When you choose a [facility] primary care physician or advanced practice provider, you gain a long-term partner in health. Find a Primary Care Provider  Learn more about [facility] in-office and virtual care options:  - Get Care Now

## 2024-01-12 DIAGNOSIS — Z113 Encounter for screening for infections with a predominantly sexual mode of transmission: Secondary | ICD-10-CM | POA: Diagnosis not present

## 2024-01-12 DIAGNOSIS — Z01419 Encounter for gynecological examination (general) (routine) without abnormal findings: Secondary | ICD-10-CM | POA: Diagnosis not present

## 2024-02-04 ENCOUNTER — Telehealth: Admitting: Physician Assistant

## 2024-02-04 DIAGNOSIS — J3489 Other specified disorders of nose and nasal sinuses: Secondary | ICD-10-CM | POA: Diagnosis not present

## 2024-02-04 DIAGNOSIS — J340 Abscess, furuncle and carbuncle of nose: Secondary | ICD-10-CM

## 2024-02-04 MED ORDER — CEPHALEXIN 500 MG PO CAPS: 500.0000 mg | ORAL_CAPSULE | Freq: Three times a day (TID) | ORAL | 0 refills | Status: AC

## 2024-02-04 NOTE — Progress Notes (Signed)
 Virtual Visit Consent   DRINDA HASBERRY, you are scheduled for a virtual visit with a [facility] provider today. Just as with appointments in the office, your consent must be obtained to participate. Your consent will be active for this visit and any virtual visit you may have with one of our providers in the next 365 days. If you have a MyChart account, a copy of this consent can be sent to you electronically.  As this is a virtual visit, video technology does not allow for your provider to perform a traditional examination. This may limit your provider's ability to fully assess your condition. If your provider identifies any concerns that need to be evaluated in person or the need to arrange testing (such as labs, EKG, etc.), we will make arrangements to do so. Although advances in technology are sophisticated, we cannot ensure that it will always work on either your end or our end. If the connection with a video visit is poor, the visit may have to be switched to a telephone visit. With either a video or telephone visit, we are not always able to ensure that we have a secure connection.  By engaging in this virtual visit, you consent to the provision of healthcare and authorize for your insurance to be billed (if applicable) for the services provided during this visit. Depending on your insurance coverage, you may receive a charge related to this service.  I need to obtain your verbal consent now. Are you willing to proceed with your visit today? [name] has provided verbal consent on 02/04/2024 for a virtual visit (video or telephone). Leah Park, [place]  Date: 02/04/2024 12:10 PM   Virtual Visit via Video Note   I, Leah Park, connected with  Leah Park  ([id number], 06-Aug-2001) on 02/04/24 at 12:00 PM EDT by a video-enabled telemedicine application and verified that I am speaking with the correct person using two identifiers.  Location: Patient: Virtual Visit  Location Patient: Home Provider: Virtual Visit Location Provider: Home Office   I discussed the limitations of evaluation and management by telemedicine and the availability of in person appointments. The patient expressed understanding and agreed to proceed.    History of Present Illness: Leah Park is a 23 y.o. who identifies as a female who was assigned female at birth, and is being seen today for pain and redness of an old scar over the past day, preceded by mild itching of the nose and scar for a few additional days. Now with swelling. Denies fever, chills. Scar is s/p rhinoplasty in 2024.  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   GAD (generalized anxiety disorder) 12/22/2013   MDD (major depressive disorder) 12/22/2013    Allergies: No Known Allergies Medications:  Current Outpatient Medications:    cephALEXin  (KEFLEX ) 500 MG capsule, Take 1 capsule (500 mg total) by mouth 3 (three) times daily for 7 days., Disp: 21 capsule, Rfl: 0   buPROPion ER (WELLBUTRIN SR) 100 MG 12 hr tablet, Take 1 tablet by mouth daily., Disp: , Rfl:    fluticasone  (FLONASE ) 50 MCG/ACT nasal spray, Place 2 sprays into both nostrils daily., Disp: 16 g, Rfl: 0   norethindrone-ethinyl estradiol-FE (JUNEL FE 1/20) 1-20 MG-MCG tablet, Take 1 tablet by mouth daily., Disp: 84 tablet, Rfl: 0  Observations/Objective: Patient is well-developed, well-nourished in no acute distress.  Resting comfortably at home.  Head is normocephalic,  atraumatic.  No labored breathing.  Speech is clear and coherent with logical content.  Patient is alert and oriented at baseline.  Noted erythema and swelling of anterior nares, extending up the nasal bridge  Assessment and Plan: 1. Nasal vestibulitis (Primary) - cephALEXin  (KEFLEX ) 500 MG capsule; Take 1 capsule (500 mg total) by mouth 3 (three) times daily for 7 days.  Dispense: 21 capsule; Refill: 0  2.  Cellulitis of nose - cephALEXin  (KEFLEX ) 500 MG capsule; Take 1 capsule (500 mg total) by mouth 3 (three) times daily for 7 days.  Dispense: 21 capsule; Refill: 0  Supportive measures and OTC medications reviewed.  Start Keflex  500 mg 3 times daily for 7 days.  Strict in person follow-up precautions reviewed with patient.  Follow Up Instructions: I discussed the assessment and treatment plan with the patient. The patient was provided an opportunity to ask questions and all were answered. The patient agreed with the plan and demonstrated an understanding of the instructions.  A copy of instructions were sent to the patient via MyChart unless otherwise noted below.   The patient was advised to call back or seek an in-person evaluation if the symptoms worsen or if the condition fails to improve as anticipated.    Leah Maillard, PA-C

## 2024-02-04 NOTE — Patient Instructions (Signed)
  Corwin Dings, thank you for joining Hyla Maillard, PA-C for today's virtual visit.  While this provider is not your primary care provider (PCP), if your PCP is located in our provider database this encounter information will be shared with them immediately following your visit.   A [facility] MyChart account gives you access to today's visit and all your visits, tests, and labs performed at [facility] " click here if you don't have a [facility] MyChart account or go to [email or web address]  Consent: (Patient) VONITA DELAET provided verbal consent for this virtual visit at the beginning of the encounter.  Current Medications:  Current Outpatient Medications:    cephALEXin  (KEFLEX ) 500 MG capsule, Take 1 capsule (500 mg total) by mouth 3 (three) times daily for 7 days., Disp: 21 capsule, Rfl: 0   buPROPion ER (WELLBUTRIN SR) 100 MG 12 hr tablet, Take 1 tablet by mouth daily., Disp: , Rfl:    fluticasone  (FLONASE ) 50 MCG/ACT nasal spray, Place 2 sprays into both nostrils daily., Disp: 16 g, Rfl: 0   norethindrone-ethinyl estradiol-FE (JUNEL FE 1/20) 1-20 MG-MCG tablet, Take 1 tablet by mouth daily., Disp: 84 tablet, Rfl: 0   Medications ordered in this encounter:  Meds ordered this encounter  Medications   cephALEXin  (KEFLEX ) 500 MG capsule    Sig: Take 1 capsule (500 mg total) by mouth 3 (three) times daily for 7 days.    Dispense:  21 capsule    Refill:  0    Supervising Provider:   Corine Dice [[id number]]     *If you need refills on other medications prior to your next appointment, please contact your pharmacy*  Follow-Up: Call back or seek an in-person evaluation if the symptoms worsen or if the condition fails to improve as anticipated.  [facility] [phone]  Other Instructions Please keep the skin clean and dry. Consider using a long-acting, nondrowsy antihistamine like Claritin or Xyzal once daily for the next couple of  days. Take the antibiotic as directed. If symptoms are not turning the corner over the next 48 hours, or you note any new or progressive symptoms despite treatment, please seek an in person evaluation ASAP. Do not delay care.   If you have been instructed to have an in-person evaluation today at a local Urgent Care facility, please use the link below. It will take you to a list of all of our available [facility], including address, phone number and hours of operation. Please do not delay care.  [facility]  If you or a family member do not have a primary care provider, use the link below to schedule a visit and establish care. When you choose a [facility] primary care physician or advanced practice provider, you gain a long-term partner in health. Find a Primary Care Provider  Learn more about [facility] in-office and virtual care options: [facility] - Get Care Now

## 2024-02-19 DIAGNOSIS — F419 Anxiety disorder, unspecified: Secondary | ICD-10-CM | POA: Diagnosis not present

## 2024-02-25 DIAGNOSIS — L821 Other seborrheic keratosis: Secondary | ICD-10-CM | POA: Diagnosis not present

## 2024-02-25 DIAGNOSIS — L910 Hypertrophic scar: Secondary | ICD-10-CM | POA: Diagnosis not present

## 2024-02-25 DIAGNOSIS — D2261 Melanocytic nevi of right upper limb, including shoulder: Secondary | ICD-10-CM | POA: Diagnosis not present

## 2024-02-29 DIAGNOSIS — F419 Anxiety disorder, unspecified: Secondary | ICD-10-CM | POA: Diagnosis not present

## 2024-03-10 ENCOUNTER — Telehealth

## 2024-03-14 DIAGNOSIS — F419 Anxiety disorder, unspecified: Secondary | ICD-10-CM | POA: Diagnosis not present

## 2024-03-15 DIAGNOSIS — F411 Generalized anxiety disorder: Secondary | ICD-10-CM | POA: Diagnosis not present

## 2024-03-15 DIAGNOSIS — F331 Major depressive disorder, recurrent, moderate: Secondary | ICD-10-CM | POA: Diagnosis not present

## 2024-03-19 NOTE — Progress Notes (Deleted)
 [facility] at [facility] [address] [place], [place] [address] [phone]  Date:  03/21/2024   Name:  PETA PEACHEY   DOB:  [birth year]   MRN:  [id number]  PCP:  Watt Harlene JAYSON, MD    Chief Complaint: No chief complaint on file.   History of Present Illness:  CLARISE CHACKO is a 23 y.o. very pleasant female patient who presents with the following:  Generally healthy young woman here today with concern of dizziness/vertigo.  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[employer]  Patient Active Problem List   Diagnosis Date Noted   Coccygeal fistula 09/03/2015   GAD (generalized anxiety disorder) 12/22/2013   MDD (major depressive disorder) 12/22/2013    Past Medical History:  Diagnosis Date   Anxiety    Depression     Past Surgical History:  Procedure Laterality Date   THYROID  CYST EXCISION      Social History   Tobacco Use   Smoking status: Never   Smokeless tobacco: Never  Substance Use Topics   Alcohol use: No   Drug use: No    Family History  Problem Relation Age of Onset   Depression Maternal Aunt    Anxiety disorder Maternal Aunt    Bipolar disorder Maternal Aunt    Anxiety disorder Cousin     No Known Allergies  Medication list has been reviewed and updated.  Current Outpatient Medications on File Prior to Visit  Medication Sig Dispense Refill   buPROPion ER (WELLBUTRIN SR) 100 MG 12 hr tablet Take 1 tablet by mouth daily.     fluticasone  (FLONASE ) 50 MCG/ACT nasal spray Place 2 sprays into both nostrils daily. 16 g 0   norethindrone-ethinyl estradiol-FE (JUNEL FE 1/20) 1-20 MG-MCG tablet Take 1 tablet by mouth daily. 84 tablet 0   No current facility-administered medications on file prior to visit.    Review of Systems:  As per HPI- otherwise negative.   Physical Examination: There were no vitals filed for this visit. There were no vitals  filed for this visit. There is no height or weight on file to calculate BMI. Ideal Body Weight:    GEN: no acute distress. HEENT: Atraumatic, Normocephalic.  Ears and Nose: No external deformity. CV: RRR, No M/G/R. No JVD. No thrill. No extra heart sounds. PULM: CTA B, no wheezes, crackles, rhonchi. No retractions. No resp. distress. No accessory muscle use. ABD: S, NT, ND, +BS. No rebound. No HSM. EXTR: No c/c/e PSYCH: Normally interactive. Conversant.    Assessment and Plan: ***  Signed Harlene Watt, MD

## 2024-03-21 ENCOUNTER — Ambulatory Visit: Admitting: Family Medicine

## 2024-03-26 NOTE — Progress Notes (Deleted)
 [facility] at [facility] [address] [place], [place] [address] [phone]  Date:  03/28/2024   Name:  Leah Park   DOB:  03/20/2001   MRN:  [id number]  PCP:  Watt Harlene JAYSON, MD    Chief Complaint: No chief complaint on file.   History of Present Illness:  Leah Park is a 23 y.o. very pleasant female patient who presents with the following:  Generally healthy young woman here today with concern of dizziness/vertigo.  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[employer]  Pap screening Can update blood work For STI screening Patient Active Problem List   Diagnosis Date Noted   Coccygeal fistula 09/03/2015   GAD (generalized anxiety disorder) 12/22/2013   MDD (major depressive disorder) 12/22/2013    Past Medical History:  Diagnosis Date   Anxiety    Depression     Past Surgical History:  Procedure Laterality Date   THYROID  CYST EXCISION      Social History   Tobacco Use   Smoking status: Never   Smokeless tobacco: Never  Substance Use Topics   Alcohol use: No   Drug use: No    Family History  Problem Relation Age of Onset   Depression Maternal Aunt    Anxiety disorder Maternal Aunt    Bipolar disorder Maternal Aunt    Anxiety disorder Cousin     No Known Allergies  Medication list has been reviewed and updated.  Current Outpatient Medications on File Prior to Visit  Medication Sig Dispense Refill   buPROPion ER (WELLBUTRIN SR) 100 MG 12 hr tablet Take 1 tablet by mouth daily.     fluticasone  (FLONASE ) 50 MCG/ACT nasal spray Place 2 sprays into both nostrils daily. 16 g 0   norethindrone-ethinyl estradiol-FE (JUNEL FE 1/20) 1-20 MG-MCG tablet Take 1 tablet by mouth daily. 84 tablet 0   No current facility-administered medications on file prior to visit.    Review of Systems:  As per HPI- otherwise negative.   Physical Examination: There were  no vitals filed for this visit. There were no vitals filed for this visit. There is no height or weight on file to calculate BMI. Ideal Body Weight:    GEN: no acute distress. HEENT: Atraumatic, Normocephalic.  Ears and Nose: No external deformity. CV: RRR, No M/G/R. No JVD. No thrill. No extra heart sounds. PULM: CTA B, no wheezes, crackles, rhonchi. No retractions. No resp. distress. No accessory muscle use. ABD: S, NT, ND, +BS. No rebound. No HSM. EXTR: No c/c/e PSYCH: Normally interactive. Conversant.    Assessment and Plan: ***  Signed Harlene Watt, MD

## 2024-03-26 NOTE — Patient Instructions (Incomplete)
It was good to see you again today!   

## 2024-03-28 ENCOUNTER — Ambulatory Visit: Admitting: Family Medicine

## 2024-03-28 DIAGNOSIS — F419 Anxiety disorder, unspecified: Secondary | ICD-10-CM | POA: Diagnosis not present

## 2024-03-30 ENCOUNTER — Ambulatory Visit: Admitting: Family Medicine

## 2024-04-20 DIAGNOSIS — F419 Anxiety disorder, unspecified: Secondary | ICD-10-CM | POA: Diagnosis not present

## 2024-04-28 DIAGNOSIS — D2261 Melanocytic nevi of right upper limb, including shoulder: Secondary | ICD-10-CM | POA: Diagnosis not present

## 2024-04-28 DIAGNOSIS — D485 Neoplasm of uncertain behavior of skin: Secondary | ICD-10-CM | POA: Diagnosis not present

## 2024-04-28 DIAGNOSIS — C4441 Basal cell carcinoma of skin of scalp and neck: Secondary | ICD-10-CM | POA: Diagnosis not present

## 2024-04-28 DIAGNOSIS — L910 Hypertrophic scar: Secondary | ICD-10-CM | POA: Diagnosis not present

## 2024-05-11 DIAGNOSIS — F419 Anxiety disorder, unspecified: Secondary | ICD-10-CM | POA: Diagnosis not present

## 2024-05-19 DIAGNOSIS — F419 Anxiety disorder, unspecified: Secondary | ICD-10-CM | POA: Diagnosis not present

## 2024-05-26 DIAGNOSIS — C4441 Basal cell carcinoma of skin of scalp and neck: Secondary | ICD-10-CM | POA: Diagnosis not present

## 2024-05-27 DIAGNOSIS — F419 Anxiety disorder, unspecified: Secondary | ICD-10-CM | POA: Diagnosis not present

## 2024-06-03 DIAGNOSIS — F419 Anxiety disorder, unspecified: Secondary | ICD-10-CM | POA: Diagnosis not present

## 2024-06-06 DIAGNOSIS — F331 Major depressive disorder, recurrent, moderate: Secondary | ICD-10-CM | POA: Diagnosis not present

## 2024-06-06 DIAGNOSIS — F411 Generalized anxiety disorder: Secondary | ICD-10-CM | POA: Diagnosis not present

## 2024-06-10 DIAGNOSIS — F419 Anxiety disorder, unspecified: Secondary | ICD-10-CM | POA: Diagnosis not present

## 2024-06-24 DIAGNOSIS — F419 Anxiety disorder, unspecified: Secondary | ICD-10-CM | POA: Diagnosis not present

## 2024-07-07 DIAGNOSIS — F419 Anxiety disorder, unspecified: Secondary | ICD-10-CM | POA: Diagnosis not present

## 2024-07-13 ENCOUNTER — Other Ambulatory Visit (INDEPENDENT_AMBULATORY_CARE_PROVIDER_SITE_OTHER)

## 2024-07-13 DIAGNOSIS — Z23 Encounter for immunization: Secondary | ICD-10-CM | POA: Diagnosis not present

## 2024-07-14 DIAGNOSIS — F419 Anxiety disorder, unspecified: Secondary | ICD-10-CM | POA: Diagnosis not present

## 2024-07-15 ENCOUNTER — Telehealth

## 2024-07-22 DIAGNOSIS — L814 Other melanin hyperpigmentation: Secondary | ICD-10-CM | POA: Diagnosis not present

## 2024-07-22 DIAGNOSIS — L821 Other seborrheic keratosis: Secondary | ICD-10-CM | POA: Diagnosis not present

## 2024-07-22 DIAGNOSIS — F419 Anxiety disorder, unspecified: Secondary | ICD-10-CM | POA: Diagnosis not present

## 2024-07-22 DIAGNOSIS — L578 Other skin changes due to chronic exposure to nonionizing radiation: Secondary | ICD-10-CM | POA: Diagnosis not present

## 2024-07-22 DIAGNOSIS — Z85828 Personal history of other malignant neoplasm of skin: Secondary | ICD-10-CM | POA: Diagnosis not present

## 2024-07-22 DIAGNOSIS — D2261 Melanocytic nevi of right upper limb, including shoulder: Secondary | ICD-10-CM | POA: Diagnosis not present

## 2024-07-22 DIAGNOSIS — D2272 Melanocytic nevi of left lower limb, including hip: Secondary | ICD-10-CM | POA: Diagnosis not present

## 2024-07-22 DIAGNOSIS — D485 Neoplasm of uncertain behavior of skin: Secondary | ICD-10-CM | POA: Diagnosis not present

## 2024-07-22 DIAGNOSIS — D2262 Melanocytic nevi of left upper limb, including shoulder: Secondary | ICD-10-CM | POA: Diagnosis not present

## 2024-07-29 DIAGNOSIS — F419 Anxiety disorder, unspecified: Secondary | ICD-10-CM | POA: Diagnosis not present

## 2024-08-05 DIAGNOSIS — F419 Anxiety disorder, unspecified: Secondary | ICD-10-CM | POA: Diagnosis not present

## 2024-08-11 ENCOUNTER — Telehealth: Admitting: Physician Assistant

## 2024-08-11 DIAGNOSIS — K047 Periapical abscess without sinus: Secondary | ICD-10-CM | POA: Diagnosis not present

## 2024-08-11 MED ORDER — AMOXICILLIN-POT CLAVULANATE 875-125 MG PO TABS: 1.0000 | ORAL_TABLET | Freq: Two times a day (BID) | ORAL | 0 refills | Status: AC

## 2024-08-11 NOTE — Patient Instructions (Signed)
 Leah Park, thank you for joining Leah Velma Lunger, PA-C for today's virtual visit.  While this provider is not your primary care provider (PCP), if your PCP is located in our provider database this encounter information will be shared with them immediately following your visit.   A [facility] MyChart account gives you access to today's visit and all your visits, tests, and labs performed at [facility]  click here if you don't have a [facility] MyChart account or go to [email or web address]  Consent: (Patient) Leah Park provided verbal consent for this virtual visit at the beginning of the encounter.  Current Medications:  Current Outpatient Medications:    buPROPion ER (WELLBUTRIN SR) 100 MG 12 hr tablet, Take 1 tablet by mouth daily., Disp: , Rfl:    fluticasone  (FLONASE ) 50 MCG/ACT nasal spray, Place 2 sprays into both nostrils daily., Disp: 16 g, Rfl: 0   norethindrone-ethinyl estradiol-FE (JUNEL FE 1/20) 1-20 MG-MCG tablet, Take 1 tablet by mouth daily., Disp: 84 tablet, Rfl: 0   Medications ordered in this encounter:  No orders of the defined types were placed in this encounter.    *If you need refills on other medications prior to your next appointment, please contact your pharmacy*  Follow-Up: Call back or seek an in-person evaluation if the symptoms worsen or if the condition fails to improve as anticipated.  [facility] [phone]  Other Instructions Dental Abscess  A dental abscess is an area of pus in or around a tooth. It comes from an infection. It can cause pain and other symptoms. Treatment will help with symptoms and prevent the infection from spreading. What are the causes? This condition is caused by an infection in or around the tooth. This can be from: Very bad tooth decay (cavities). A bad injury to the tooth, such as a broken or chipped tooth. What increases the risk? The risk to get an abscess is higher in  males. It is also more likely in people who: Have dental decay. Have very bad gum disease. Eat sugary snacks between meals. Use tobacco. Have diabetes. Have a weak disease-fighting system (immune system). Do not brush their teeth regularly. What are the signs or symptoms? Some mild symptoms are: Tenderness. Bad breath. Fever. A sharp, sour taste in the mouth. Pain in and around the infected tooth. Worse symptoms of this condition include: Swollen neck glands. Chills. Pus draining around the tooth. Swelling and redness around the tooth, the mouth, or the face. Very bad pain in and around the tooth. The worst symptoms can include: Difficulty swallowing. Difficulty opening your mouth. Feeling like you may vomit or vomiting. How is this treated? This is treated by getting rid of the infection. Your dentist will discuss ways to do this, including: Antibiotic medicines. Antibacterial mouth rinse. An incision in the abscess to drain out the pus. A root canal. Removing the tooth. Follow these instructions at home: Medicines Take over-the-counter and prescription medicines only as told by your dentist. If you were prescribed an antibiotic medicine, take it as told by your dentist. Do not stop taking it even if you start to feel better. If you were prescribed a gel that has numbing medicine in it, use it exactly as told. Ask your dentist if you should avoid driving or using machines while you are taking your medicine. General instructions Rinse your mouth often with salt water. To make salt water, dissolve -1 tsp (3-6 g) of salt in 1 cup (237  mL) of warm water. Eat a soft diet while your mouth is healing. Drink enough fluid to keep your pee (urine) pale yellow. Do not apply heat to the outside of your mouth. Do not smoke or use any products that contain nicotine or tobacco. If you need help quitting, ask your dentist. Keep all follow-up visits. Prevent an abscess Brush your teeth  every morning and every night. Use fluoride toothpaste. Floss your teeth each day. Get dental cleanings as often as told by your dentist. Think about getting dental sealant put on teeth that have deep holes (decay). Drink water that has fluoride in it. Most tap water has fluoride. Check the label on bottled water to see if it has fluoride in it. Drink water instead of sugary drinks. Eat healthy meals and snacks. Wear a mouth guard or face shield when you play sports. Contact a doctor if: Your pain is worse and medicine does not help. Get help right away if: You have a fever or chills. Your symptoms suddenly get worse. You have a very bad headache. You have problems breathing or swallowing. You have trouble opening your mouth. You have swelling in your neck or close to your eye. These symptoms may be an emergency. Get help right away. Call your local emergency services (911 in the [place] Do not wait to see if the symptoms will go away. Do not drive yourself to the hospital. Summary A dental abscess is an area of pus in or around a tooth. It is caused by an infection. Treatment will help with symptoms and prevent the infection from spreading. Take over-the-counter and prescription medicines only as told by your dentist. To prevent an abscess, take good care of your teeth. Brush your teeth every morning and night. Use floss every day. Get dental cleanings as often as told by your dentist. This information is not intended to replace advice given to you by your health care provider. Make sure you discuss any questions you have with your health care provider. Document Revised: 11/14/2020 Document Reviewed: 11/15/2020 Elsevier Patient Education  2024 Elsevier Inc.   If you have been instructed to have an in-person evaluation today at a local Urgent Care facility, please use the link below. It will take you to a list of all of our available [facility], including address, phone  number and hours of operation. Please do not delay care.  [facility]  If you or a family member do not have a primary care provider, use the link below to schedule a visit and establish care. When you choose a [facility] primary care physician or advanced practice provider, you gain a long-term partner in health. Find a Primary Care Provider  Learn more about [facility] in-office and virtual care options:  - Get Care Now

## 2024-08-11 NOTE — Progress Notes (Signed)
 Virtual Visit Consent   Leah Park, you are scheduled for a virtual visit with a [employer] provider today. Just as with appointments in the office, your consent must be obtained to participate. Your consent will be active for this visit and any virtual visit you may have with one of our providers in the next 365 days. If you have a MyChart account, a copy of this consent can be sent to you electronically.  As this is a virtual visit, video technology does not allow for your provider to perform a traditional examination. This may limit your provider's ability to fully assess your condition. If your provider identifies any concerns that need to be evaluated in person or the need to arrange testing (such as labs, EKG, etc.), we will make arrangements to do so. Although advances in technology are sophisticated, we cannot ensure that it will always work on either your end or our end. If the connection with a video visit is poor, the visit may have to be switched to a telephone visit. With either a video or telephone visit, we are not always able to ensure that we have a secure connection.  By engaging in this virtual visit, you consent to the provision of healthcare and authorize for your insurance to be billed (if applicable) for the services provided during this visit. Depending on your insurance coverage, you may receive a charge related to this service.  I need to obtain your verbal consent now. Are you willing to proceed with your visit today? Leah Park has provided verbal consent on 08/11/2024 for a virtual visit (video or telephone). Leah Park, [place]  Date: 08/11/2024 11:44 AM   Virtual Visit via Video Note   I, Leah Park, connected with  Leah Park  ([id number], 2001-07-12) on 08/11/24 at 11:45 AM EST by a video-enabled telemedicine application and verified that I am speaking with the correct person using two identifiers.  Location: Patient: Virtual Visit  Location Patient: Home Provider: Virtual Visit Location Provider: Home Office   I discussed the limitations of evaluation and management by telemedicine and the availability of in person appointments. The patient expressed understanding and agreed to proceed.    History of Present Illness: Leah Park is a 23 y.o. who identifies as a female who was assigned female at birth, and is being seen today for concern of a potential dental infection at site of an impacted wisdom tooth.  Notes pain and swelling at the base of her left lower wisdom tooth over the past several days.  Reach out to her dentist but they are unable to see her till next week.  Appointment scheduled for next Tuesday.  Has been taking some Aleve over-the-counter to help with discomfort.  Denies any trauma or injury to the area.  Denies fevers or chills.SABRA  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   GAD (generalized anxiety disorder) 12/22/2013   MDD (major depressive disorder) 12/22/2013    Allergies: No Known Allergies Medications:  Current Outpatient Medications:    amoxicillin -clavulanate (AUGMENTIN ) 875-125 MG tablet, Take 1 tablet by mouth 2 (two) times daily., Disp: 14 tablet, Rfl: 0   traZODone (DESYREL) 50 MG tablet, Take 25-50 mg by mouth at bedtime as needed., Disp: , Rfl:    buPROPion ER (WELLBUTRIN SR) 100 MG 12 hr tablet, Take 1 tablet by mouth daily., Disp: , Rfl:    norethindrone-ethinyl estradiol-FE (  JUNEL FE 1/20) 1-20 MG-MCG tablet, Take 1 tablet by mouth daily., Disp: 84 tablet, Rfl: 0  Observations/Objective: Patient is well-developed, well-nourished in no acute distress.  Resting comfortably  at home.  Head is normocephalic, atraumatic.  No labored breathing.  Speech is clear and coherent with logical content.  Patient is alert and oriented at baseline.   Assessment and Plan: 1. Dental infection (Primary) - amoxicillin -clavulanate  (AUGMENTIN ) 875-125 MG tablet; Take 1 tablet by mouth 2 (two) times daily.  Dispense: 14 tablet; Refill: 0  Supportive measures and OTC medications reviewed.  Augmentin  per orders.  Follow-up with dentist as scheduled next week.  Follow Up Instructions: I discussed the assessment and treatment plan with the patient. The patient was provided an opportunity to ask questions and all were answered. The patient agreed with the plan and demonstrated an understanding of the instructions.  A copy of instructions were sent to the patient via MyChart unless otherwise noted below.   The patient was advised to call back or seek an in-person evaluation if the symptoms worsen or if the condition fails to improve as anticipated.    Leah Velma Lunger, PA-C

## 2024-08-12 DIAGNOSIS — F419 Anxiety disorder, unspecified: Secondary | ICD-10-CM | POA: Diagnosis not present

## 2024-08-26 DIAGNOSIS — F419 Anxiety disorder, unspecified: Secondary | ICD-10-CM | POA: Diagnosis not present

## 2024-09-09 DIAGNOSIS — F419 Anxiety disorder, unspecified: Secondary | ICD-10-CM | POA: Diagnosis not present
